# Patient Record
Sex: Female | Born: 1945
Health system: Southern US, Community
[De-identification: ages and names within clinical notes are randomized; demographics above are authoritative.]

## PROBLEM LIST (undated history)

## (undated) DIAGNOSIS — D649 Anemia, unspecified: Secondary | ICD-10-CM

## (undated) DIAGNOSIS — I10 Essential (primary) hypertension: Secondary | ICD-10-CM

## (undated) HISTORY — PX: ABDOMINAL HYSTERECTOMY: SHX81

---

## 1999-02-20 ENCOUNTER — Other Ambulatory Visit: Admission: RE | Admit: 1999-02-20 | Discharge: 1999-02-20 | Payer: Self-pay | Admitting: Family Medicine

## 2000-03-24 ENCOUNTER — Other Ambulatory Visit: Admission: RE | Admit: 2000-03-24 | Discharge: 2000-03-24 | Payer: Self-pay | Admitting: Family Medicine

## 2001-08-11 ENCOUNTER — Other Ambulatory Visit: Admission: RE | Admit: 2001-08-11 | Discharge: 2001-08-11 | Payer: Self-pay | Admitting: Family Medicine

## 2003-04-21 ENCOUNTER — Encounter: Payer: Self-pay | Admitting: Internal Medicine

## 2003-06-12 ENCOUNTER — Emergency Department (HOSPITAL_COMMUNITY): Admission: EM | Admit: 2003-06-12 | Discharge: 2003-06-12 | Payer: Self-pay | Admitting: Emergency Medicine

## 2004-10-15 ENCOUNTER — Ambulatory Visit: Payer: Self-pay | Admitting: Internal Medicine

## 2004-10-23 ENCOUNTER — Ambulatory Visit: Payer: Self-pay | Admitting: Internal Medicine

## 2004-10-29 ENCOUNTER — Ambulatory Visit: Payer: Self-pay | Admitting: Internal Medicine

## 2005-01-28 ENCOUNTER — Ambulatory Visit: Payer: Self-pay | Admitting: Internal Medicine

## 2005-02-04 ENCOUNTER — Ambulatory Visit: Payer: Self-pay | Admitting: Internal Medicine

## 2005-05-28 ENCOUNTER — Ambulatory Visit: Payer: Self-pay | Admitting: Internal Medicine

## 2005-06-09 ENCOUNTER — Ambulatory Visit: Payer: Self-pay | Admitting: Internal Medicine

## 2005-08-12 ENCOUNTER — Ambulatory Visit: Payer: Self-pay | Admitting: Internal Medicine

## 2005-08-18 ENCOUNTER — Ambulatory Visit: Payer: Self-pay

## 2006-02-26 ENCOUNTER — Ambulatory Visit: Payer: Self-pay | Admitting: Internal Medicine

## 2006-04-03 ENCOUNTER — Ambulatory Visit: Payer: Self-pay | Admitting: Internal Medicine

## 2006-04-30 ENCOUNTER — Ambulatory Visit: Payer: Self-pay | Admitting: Internal Medicine

## 2006-05-18 ENCOUNTER — Ambulatory Visit: Payer: Self-pay | Admitting: Internal Medicine

## 2006-06-22 ENCOUNTER — Encounter: Payer: Self-pay | Admitting: Internal Medicine

## 2006-12-03 ENCOUNTER — Ambulatory Visit: Payer: Self-pay | Admitting: Internal Medicine

## 2007-06-25 ENCOUNTER — Encounter: Payer: Self-pay | Admitting: Internal Medicine

## 2007-06-30 DIAGNOSIS — E785 Hyperlipidemia, unspecified: Secondary | ICD-10-CM

## 2007-06-30 DIAGNOSIS — I10 Essential (primary) hypertension: Secondary | ICD-10-CM | POA: Insufficient documentation

## 2007-09-30 ENCOUNTER — Encounter: Payer: Self-pay | Admitting: Internal Medicine

## 2008-02-09 ENCOUNTER — Ambulatory Visit: Payer: Self-pay | Admitting: Internal Medicine

## 2008-02-09 LAB — CONVERTED CEMR LAB
ALT: 17 units/L (ref 0–35)
AST: 21 units/L (ref 0–37)
Albumin: 3.9 g/dL (ref 3.5–5.2)
Alkaline Phosphatase: 76 units/L (ref 39–117)
BUN: 16 mg/dL (ref 6–23)
Basophils Absolute: 0.1 10*3/uL (ref 0.0–0.1)
Basophils Relative: 1 % (ref 0.0–1.0)
Bilirubin Urine: NEGATIVE
Bilirubin, Direct: 0.1 mg/dL (ref 0.0–0.3)
CO2: 32 meq/L (ref 19–32)
Calcium: 9.3 mg/dL (ref 8.4–10.5)
Chloride: 104 meq/L (ref 96–112)
Cholesterol: 244 mg/dL (ref 0–200)
Creatinine, Ser: 0.8 mg/dL (ref 0.4–1.2)
Direct LDL: 184.6 mg/dL
Eosinophils Absolute: 0.3 10*3/uL (ref 0.0–0.7)
Eosinophils Relative: 4.8 % (ref 0.0–5.0)
GFR calc Af Amer: 93 mL/min
GFR calc non Af Amer: 77 mL/min
Glucose, Bld: 109 mg/dL — ABNORMAL HIGH (ref 70–99)
Glucose, Urine, Semiquant: NEGATIVE
HCT: 40.9 % (ref 36.0–46.0)
HDL: 39.5 mg/dL (ref >39.0)
Hemoglobin: 13.3 g/dL (ref 12.0–15.0)
Ketones, urine, test strip: NEGATIVE
Lymphocytes Relative: 30.1 % (ref 12.0–46.0)
MCHC: 32.6 g/dL (ref 30.0–36.0)
MCV: 90.5 fL (ref 78.0–100.0)
Monocytes Absolute: 0.5 10*3/uL (ref 0.1–1.0)
Monocytes Relative: 8.7 % (ref 3.0–12.0)
Neutro Abs: 3 10*3/uL (ref 1.4–7.7)
Neutrophils Relative %: 55.4 % (ref 43.0–77.0)
Nitrite: NEGATIVE
Platelets: 262 10*3/uL (ref 150–400)
Potassium: 4.3 meq/L (ref 3.5–5.1)
Protein, U semiquant: NEGATIVE
RBC: 4.52 M/uL (ref 3.87–5.11)
RDW: 13 % (ref 11.5–14.6)
Sodium: 142 meq/L (ref 135–145)
Specific Gravity, Urine: 1.015
TSH: 1.61 microintl units/mL (ref 0.35–5.50)
Total Bilirubin: 0.4 mg/dL (ref 0.3–1.2)
Total CHOL/HDL Ratio: 6.2
Total Protein: 6.8 g/dL (ref 6.0–8.3)
Triglycerides: 126 mg/dL (ref 0–149)
Urobilinogen, UA: 0.2
VLDL: 25 mg/dL (ref 0–40)
WBC: 5.6 10*3/uL (ref 4.5–10.5)
pH: 7.5

## 2008-02-16 ENCOUNTER — Ambulatory Visit: Payer: Self-pay | Admitting: Internal Medicine

## 2008-09-05 ENCOUNTER — Ambulatory Visit: Payer: Self-pay | Admitting: Family Medicine

## 2008-09-06 ENCOUNTER — Ambulatory Visit: Payer: Self-pay | Admitting: Family Medicine

## 2008-11-22 ENCOUNTER — Encounter: Payer: Self-pay | Admitting: Internal Medicine

## 2008-11-23 ENCOUNTER — Ambulatory Visit: Payer: Self-pay | Admitting: Internal Medicine

## 2008-12-22 ENCOUNTER — Ambulatory Visit: Payer: Self-pay | Admitting: Internal Medicine

## 2008-12-22 DIAGNOSIS — R079 Chest pain, unspecified: Secondary | ICD-10-CM

## 2008-12-25 LAB — CONVERTED CEMR LAB
BUN: 16 mg/dL (ref 6–23)
CO2: 34 meq/L — ABNORMAL HIGH (ref 19–32)
Calcium: 9.7 mg/dL (ref 8.4–10.5)
Chloride: 100 meq/L (ref 96–112)
Creatinine, Ser: 0.7 mg/dL (ref 0.4–1.2)
GFR calc Af Amer: 109 mL/min
GFR calc non Af Amer: 90 mL/min
Glucose, Bld: 79 mg/dL (ref 70–99)
Potassium: 3.9 meq/L (ref 3.5–5.1)
Sodium: 140 meq/L (ref 135–145)

## 2008-12-28 ENCOUNTER — Encounter: Payer: Self-pay | Admitting: Internal Medicine

## 2008-12-28 ENCOUNTER — Ambulatory Visit: Payer: Self-pay

## 2010-11-30 ENCOUNTER — Encounter: Payer: Self-pay | Admitting: Orthopedic Surgery

## 2010-12-10 NOTE — Consult Note (Signed)
Summary: Consultation Report  Consultation Report   Imported By: Kassie Mends 02/21/2008 08:51:18  _____________________________________________________________________  External Attachment:    Type:   Image     Comment:   Dr Yolanda Bonine note

## 2013-02-07 ENCOUNTER — Emergency Department (HOSPITAL_COMMUNITY)
Admission: EM | Admit: 2013-02-07 | Discharge: 2013-02-07 | Disposition: A | Discharge status: Home / Self Care | Payer: No Typology Code available for payment source | Attending: Emergency Medicine | Admitting: Emergency Medicine

## 2013-02-07 ENCOUNTER — Encounter (HOSPITAL_COMMUNITY): Payer: Self-pay | Admitting: Emergency Medicine

## 2013-02-07 ENCOUNTER — Emergency Department (HOSPITAL_COMMUNITY): Payer: No Typology Code available for payment source

## 2013-02-07 DIAGNOSIS — S99919A Unspecified injury of unspecified ankle, initial encounter: Secondary | ICD-10-CM | POA: Insufficient documentation

## 2013-02-07 DIAGNOSIS — Y92410 Unspecified street and highway as the place of occurrence of the external cause: Secondary | ICD-10-CM | POA: Insufficient documentation

## 2013-02-07 DIAGNOSIS — I10 Essential (primary) hypertension: Secondary | ICD-10-CM | POA: Insufficient documentation

## 2013-02-07 DIAGNOSIS — S8990XA Unspecified injury of unspecified lower leg, initial encounter: Secondary | ICD-10-CM | POA: Insufficient documentation

## 2013-02-07 DIAGNOSIS — Y9389 Activity, other specified: Secondary | ICD-10-CM | POA: Insufficient documentation

## 2013-02-07 DIAGNOSIS — S0993XA Unspecified injury of face, initial encounter: Secondary | ICD-10-CM | POA: Insufficient documentation

## 2013-02-07 DIAGNOSIS — IMO0002 Reserved for concepts with insufficient information to code with codable children: Secondary | ICD-10-CM | POA: Insufficient documentation

## 2013-02-07 HISTORY — DX: Essential (primary) hypertension: I10

## 2013-02-07 MED ORDER — METHOCARBAMOL 500 MG PO TABS: 500.0000 mg | ORAL_TABLET | Freq: Two times a day (BID) | ORAL | Status: DC

## 2013-02-07 MED ORDER — IBUPROFEN 600 MG PO TABS: 600.0000 mg | ORAL_TABLET | Freq: Four times a day (QID) | ORAL | Status: DC | PRN

## 2013-02-07 MED ORDER — ONDANSETRON 4 MG PO TBDP
ORAL_TABLET | ORAL | Status: AC
  Filled 2013-02-07: qty 1
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
## 2013-11-21 ENCOUNTER — Ambulatory Visit: Payer: Self-pay | Admitting: Gastroenterology

## 2013-11-23 LAB — PATHOLOGY REPORT

## 2013-12-21 ENCOUNTER — Ambulatory Visit: Payer: Self-pay | Admitting: Gastroenterology

## 2015-03-22 ENCOUNTER — Emergency Department
Admission: EM | Admit: 2015-03-22 | Discharge: 2015-03-22 | Disposition: A | Discharge status: Home / Self Care | Payer: Medicare PPO | Attending: Emergency Medicine | Admitting: Emergency Medicine

## 2015-03-22 ENCOUNTER — Encounter: Payer: Self-pay | Admitting: *Deleted

## 2015-03-22 DIAGNOSIS — Z79899 Other long term (current) drug therapy: Secondary | ICD-10-CM | POA: Insufficient documentation

## 2015-03-22 DIAGNOSIS — W57XXXA Bitten or stung by nonvenomous insect and other nonvenomous arthropods, initial encounter: Secondary | ICD-10-CM | POA: Insufficient documentation

## 2015-03-22 DIAGNOSIS — S30860A Insect bite (nonvenomous) of lower back and pelvis, initial encounter: Secondary | ICD-10-CM | POA: Diagnosis present

## 2015-03-22 DIAGNOSIS — Y998 Other external cause status: Secondary | ICD-10-CM | POA: Insufficient documentation

## 2015-03-22 DIAGNOSIS — Y9389 Activity, other specified: Secondary | ICD-10-CM | POA: Diagnosis not present

## 2015-03-22 DIAGNOSIS — Z880 Allergy status to penicillin: Secondary | ICD-10-CM | POA: Diagnosis not present

## 2015-03-22 DIAGNOSIS — I10 Essential (primary) hypertension: Secondary | ICD-10-CM | POA: Diagnosis not present

## 2015-03-22 DIAGNOSIS — Y9289 Other specified places as the place of occurrence of the external cause: Secondary | ICD-10-CM | POA: Insufficient documentation

## 2015-03-22 NOTE — ED Provider Notes (Signed)
[facility] Emergency Department Provider Note  ____________________________________________  Time seen: 2220  I have reviewed the triage vital signs and the nursing notes.   HISTORY  Chief Complaint Tick Removal    HPI Gabriela Sanchez is a 69 y.o. female noticed a tick on her right lower side this evening and was unable to remove it herself. She denies any symptoms of headache, fever or rash. She is unsure of how long the tick has been there but states that she has been out in the woods the last one to 2 days at most.She denies any pain. She was unable to take it off herself and she could not see in the mirror.   Past Medical History  Diagnosis Date  . Hypertension     Patient Active Problem List   Diagnosis Date Noted  . CHEST PAIN 12/22/2008  . HYPERLIPIDEMIA 06/30/2007  . HYPERTENSION 06/30/2007    History reviewed. No pertinent past surgical history.  Current Outpatient Rx  Name  Route  Sig  Dispense  Refill  . ibuprofen (ADVIL,MOTRIN) 600 MG tablet   Oral   Take 1 tablet (600 mg total) by mouth every 6 (six) hours as needed for pain.   30 tablet   0   . methocarbamol (ROBAXIN) 500 MG tablet   Oral   Take 1 tablet (500 mg total) by mouth 2 (two) times daily.   20 tablet   0   . Multiple Vitamin (MULTIVITAMIN WITH MINERALS) TABS   Oral   Take 1 tablet by mouth daily.         Marland Kitchen. omega-3 acid ethyl esters (LOVAZA) 1 G capsule   Oral   Take 1 g by mouth daily.           Allergies Ampicillin; Erythromycin ethylsuccinate; Simvastatin; and Sulfamethoxazole  No family history on file.  Social History History  Substance Use Topics  . Smoking status: Never Smoker   . Smokeless tobacco: Not on file  . Alcohol Use: No    Review of Systems Constitutional: No fever/chills Eyes: No visual changes. ENT: No sore throat. Cardiovascular: Denies chest pain. Respiratory: Denies shortness of breath. Gastrointestinal: No abdominal  pain.  No nausea, no vomiting.  Genitourinary: Negative for dysuria. Musculoskeletal: Negative for back pain. Skin: Negative for rash. Neurological: Negative for headaches  10-point ROS otherwise negative.  ____________________________________________   PHYSICAL EXAM:  VITAL SIGNS: ED Triage Vitals  Enc Vitals Group     BP 03/22/15 2105 217/66 mmHg     Pulse Rate 03/22/15 2105 69     Resp 03/22/15 2105 16     Temp 03/22/15 2105 98.1 F (36.7 C)     Temp Source 03/22/15 2105 Oral     SpO2 03/22/15 2105 99 %     Weight 03/22/15 2105 200 lb (90.719 kg)     Height 03/22/15 2105 5\' 4"  (1.626 m)     Head Cir --      Peak Flow --      Pain Score --      Pain Loc --      Pain Edu? --      Excl. in GC? --     Constitutional: Alert and oriented. Well appearing and in no acute distress. Eyes: Conjunctivae are normal. PERRL. EOMI. Head: Atraumatic. Nose: No congestion/rhinnorhea. Neck: No stridor.   Cardiovascular: Normal rate, regular rhythm. Grossly normal heart sounds.  Good peripheral circulation. Respiratory: Normal respiratory effort.  Gastrointestinal: Soft and nontender. No  distention. No abdominal bruits. No CVA tenderness. Musculoskeletal: No lower extremity tenderness nor edema.  No joint effusions. Neurologic:  Normal speech and language. No gross focal neurologic deficits are appreciated. Speech is normal. No gait instability. Skin:  Skin is warm, dry and intact. Attached tick was noted right lateral side. There is no rash surrounding the tick area. Psychiatric: Mood and affect are normal. Speech and behavior are normal.  ____________________________________________   LABS (all labs ordered are listed, but only abnormal results are displayed)  Labs Reviewed - No data to  display ____________________________________________  EKG  None ____________________________________________  RADIOLOGY  None ____________________________________________   PROCEDURES  Procedure(s) performed: Tick was removed with Kelly clamp. The area was explored with an 18-gauge needle. The entire tick was removed including head. Without any difficulty  Critical Care performed: No  ____________________________________________   INITIAL IMPRESSION / ASSESSMENT AND PLAN / ED COURSE  Pertinent labs & imaging results that were available during my care of the patient were reviewed by me and considered in my medical decision making (see chart for details).  Discussed symptoms of documented displayed fever and Lyme's disease. Patient prefers not to be on antibiotic and will contact her doctor if any symptoms ____________________________________________   FINAL CLINICAL IMPRESSION(S) / ED DIAGNOSES  Final diagnoses:  Tick bite of back, initial encounter      Tommi RumpsRhonda L Summers, PA-C 03/22/15 2239  I was apparently in the ER and available for consult during the time the patient was here but did not see the patient  Arnaldo NatalPaul F Amire Gossen, MD 03/31/15 [phone]

## 2015-03-22 NOTE — ED Notes (Signed)
Pt noticed a tick on her right lower back today.

## 2015-03-22 NOTE — ED Notes (Signed)
PA removed tick. Pt unsure of how long tick had been in place.

## 2020-03-22 ENCOUNTER — Other Ambulatory Visit: Payer: Self-pay | Admitting: Internal Medicine

## 2020-03-22 DIAGNOSIS — R079 Chest pain, unspecified: Secondary | ICD-10-CM

## 2020-03-30 ENCOUNTER — Other Ambulatory Visit (HOSPITAL_COMMUNITY): Payer: Self-pay | Admitting: Internal Medicine

## 2020-03-30 DIAGNOSIS — R079 Chest pain, unspecified: Secondary | ICD-10-CM

## 2020-04-03 ENCOUNTER — Ambulatory Visit (HOSPITAL_COMMUNITY)
Admission: RE | Admit: 2020-04-03 | Discharge: 2020-04-03 | Disposition: A | Discharge status: Home / Self Care | Payer: Medicare PPO | Source: Ambulatory Visit | Attending: Internal Medicine | Admitting: Internal Medicine

## 2020-04-03 ENCOUNTER — Other Ambulatory Visit: Payer: Self-pay

## 2020-04-03 ENCOUNTER — Encounter (HOSPITAL_COMMUNITY): Payer: Self-pay

## 2020-04-03 DIAGNOSIS — R079 Chest pain, unspecified: Secondary | ICD-10-CM | POA: Insufficient documentation

## 2020-04-03 MED ORDER — REGADENOSON 0.4 MG/5ML IV SOLN: 0.4000 mg | Freq: Once | INTRAVENOUS | Status: DC

## 2020-04-03 MED ORDER — TECHNETIUM TC 99M TETROFOSMIN IV KIT
11.0000 | PACK | Freq: Once | INTRAVENOUS | Status: AC | PRN
  Administered 2020-04-03: 11 via INTRAVENOUS

## 2020-04-03 MED ORDER — REGADENOSON 0.4 MG/5ML IV SOLN
INTRAVENOUS | Status: AC
Start: 2020-04-03 — End: 2020-04-03
  Filled 2020-04-03: qty 5

## 2020-04-03 NOTE — Progress Notes (Signed)
Patient here for nuclear stress test. Due to ordering provider, Dr. Graciela Husbands not having privileges here at [facility], test can not be performed. Dr. Odessa Fleming office aware and will call patient to reschedule at hospital where he does have privileges. Pt aware of plan. D/C home ambulatory with husband. Awake and alert. In no distress.

## 2020-04-13 ENCOUNTER — Telehealth: Payer: Self-pay | Admitting: Internal Medicine

## 2020-04-13 NOTE — Telephone Encounter (Signed)
06/04/021~Per patient, DO NOT SCHD LEXI SCAN/ no longer patient of Dr. Graciela Husbands. MF

## 2020-04-26 ENCOUNTER — Ambulatory Visit: Payer: Medicare PPO | Admitting: Cardiovascular Disease

## 2020-04-26 ENCOUNTER — Encounter: Payer: Self-pay | Admitting: Cardiovascular Disease

## 2020-04-26 ENCOUNTER — Other Ambulatory Visit: Payer: Self-pay

## 2020-04-26 VITALS — BP 136/64 | HR 65 | Ht 64.0 in | Wt 195.6 lb

## 2020-04-26 DIAGNOSIS — I208 Other forms of angina pectoris: Secondary | ICD-10-CM

## 2020-04-26 DIAGNOSIS — Z79899 Other long term (current) drug therapy: Secondary | ICD-10-CM

## 2020-04-26 DIAGNOSIS — R0989 Other specified symptoms and signs involving the circulatory and respiratory systems: Secondary | ICD-10-CM | POA: Insufficient documentation

## 2020-04-26 DIAGNOSIS — I517 Cardiomegaly: Secondary | ICD-10-CM | POA: Diagnosis not present

## 2020-04-26 DIAGNOSIS — R072 Precordial pain: Secondary | ICD-10-CM | POA: Diagnosis not present

## 2020-04-26 DIAGNOSIS — Z01812 Encounter for preprocedural laboratory examination: Secondary | ICD-10-CM

## 2020-04-26 LAB — BASIC METABOLIC PANEL
BUN/Creatinine Ratio: 18 (ref 12–28)
BUN: 18 mg/dL (ref 8–27)
CO2: 25 mmol/L (ref 20–29)
Calcium: 9.5 mg/dL (ref 8.7–10.3)
Chloride: 103 mmol/L (ref 96–106)
Creatinine, Ser: 1 mg/dL (ref 0.57–1.00)
GFR calc Af Amer: 64 mL/min/{1.73_m2} (ref >59)
GFR calc non Af Amer: 56 mL/min/{1.73_m2} — ABNORMAL LOW (ref >59)
Glucose: 82 mg/dL (ref 65–99)
Potassium: 4.8 mmol/L (ref 3.5–5.2)
Sodium: 141 mmol/L (ref 134–144)

## 2020-04-26 MED ORDER — METOPROLOL TARTRATE 50 MG PO TABS: ORAL_TABLET | ORAL | 0 refills | Status: DC

## 2020-04-26 MED ORDER — ATORVASTATIN CALCIUM 10 MG PO TABS: 10.0000 mg | ORAL_TABLET | Freq: Every day | ORAL | 3 refills | Status: DC

## 2020-04-26 NOTE — Progress Notes (Signed)
04/26/2020 Gabriela Sanchez   1946/10/02  [id number]  Primary Physician Lynnea Ferrier, MD Primary Cardiologist: Runell Gess MD FACP, [place], [place], [place]  HPI:  Gabriela Sanchez is a 74 y.o. mild to moderately overweight widowed Caucasian female mother of 2 children, grandmother of 3 grandchildren who is accompanied by C. C. Rierson, her significant other.  She was referred by Dr.Varadarajan, her PCP, for evaluation of chest pain.  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  Her cardiac risk factor profile is notable for treated hypertension and untreated hyperlipidemia.  There is no family history for heart disease.  She is never had a heart attack or stroke.  She was walking on the treadmill 3 days a week and had been noticing chest discomfort while raking the leaves in her yard.  She apparently had a stress test performed at [facility] but only half was performed because no MD was available.   Current Meds  Medication Sig  . amLODipine (NORVASC) 5 MG tablet   . aspirin 81 MG chewable tablet Chew by mouth.  . Cod Liver Oil 5000-500 UNIT/5ML OIL Take by mouth.  . CYANOCOBALAMIN PO Place under the tongue.  . Glucosamine Sulfate 500 MG TABS Take by mouth.  Marland Kitchen ibuprofen (ADVIL,MOTRIN) 600 MG tablet Take 1 tablet (600 mg total) by mouth every 6 (six) hours as needed for pain.  . Multiple Vitamin (MULTIVITAMIN WITH MINERALS) TABS Take 1 tablet by mouth daily.     Allergies  Allergen Reactions  . Ampicillin     REACTION: rash  . Erythromycin Ethylsuccinate     REACTION: rash  . Lisinopril Cough  . Losartan Other (See Comments)    Chest tightness,sob,dizziness  . Other Nausea Only  . Simvastatin     REACTION: ARM PAIN  . Sulfa Antibiotics Other (See Comments)    Gi upset  . Sulfamethoxazole     REACTION: rash    Social History   Socioeconomic History  . Marital status: Widowed    Spouse name: Not on file  .  Number of children: Not on file  . Years of education: Not on file  . Highest education level: Not on file  Occupational History  . Not on file  Tobacco Use  . Smoking status: Never Smoker  . Smokeless tobacco: Never Used  Substance and Sexual Activity  . Alcohol use: No  . Drug use: No  . Sexual activity: Not on file  Other Topics Concern  . Not on file  Social History Narrative  . Not on file   Social Determinants of Health   Financial Resource Strain:   . Difficulty of Paying Living Expenses:   Food Insecurity:   . Worried About Programme researcher, broadcasting/film/video in the Last Year:   . Barista in the Last Year:   Transportation Needs:   . Freight forwarder (Medical):   Marland Kitchen Lack of Transportation (Non-Medical):   Physical Activity:   . Days of Exercise per Week:   . Minutes of Exercise per Session:   Stress:   . Feeling of Stress :   Social Connections:   . Frequency of Communication with Friends and Family:   . Frequency of Social Gatherings with Friends and Family:   . Attends Religious Services:   . Active Member of Clubs or Organizations:   . Attends Banker Meetings:   Marland Kitchen Marital  Status:   Intimate Partner Violence:   . Fear of Current or Ex-Partner:   . Emotionally Abused:   Marland Kitchen Physically Abused:   . Sexually Abused:      Review of Systems: General: negative for chills, fever, night sweats or weight changes.  Cardiovascular: negative for chest pain, dyspnea on exertion, edema, orthopnea, palpitations, paroxysmal nocturnal dyspnea or shortness of breath Dermatological: negative for rash Respiratory: negative for cough or wheezing Urologic: negative for hematuria Abdominal: negative for nausea, vomiting, diarrhea, bright red blood per rectum, melena, or hematemesis Neurologic: negative for visual changes, syncope, or dizziness All other systems reviewed and are otherwise negative except as noted above.    Blood pressure 136/64, pulse 65, height 5'  4" (1.626 m), weight 195 lb 9.6 oz (88.7 kg), SpO2 98 %.  General appearance: alert and no distress Neck: no adenopathy, no JVD, supple, symmetrical, trachea midline, thyroid not enlarged, symmetric, no tenderness/mass/nodules and Bilateral carotid bruits Lungs: clear to auscultation bilaterally Heart: regular rate and rhythm, S1, S2 normal, no murmur, click, rub or gallop Extremities: extremities normal, atraumatic, no cyanosis or edema Pulses: 2+ and symmetric Skin: Skin color, texture, turgor normal. No rashes or lesions Neurologic: Alert and oriented X 3, normal strength and tone. Normal symmetric reflexes. Normal coordination and gait  EKG sinus rhythm at 65 with left ventricular hypertrophy and repolarization changes.  I personally reviewed this EKG.  ASSESSMENT AND PLAN:   HYPERLIPIDEMIA History of hyperlipidemia not on statin therapy with recent lipid profile performed by her PCP on 03/15/2020 revealing total cholesterol 241, LDL 168 and HDL 49.  I am going to begin her on low-dose atorvastatin and we will recheck a lipid liver profile in 2 months.  HYPERTENSION History of essential hypertension a blood pressure measured today 136/64.  She is on amlodipine.  CHEST PAIN History of exertional angina which is somewhat improved after mild weight loss.  She does have positive risk factors.  I am going to get a coronary CTA to further evaluate  Left ventricular hypertrophy Diagnosis left ventricular hypertrophy for several years which is evident on twelve-lead EKG.  We will check a 2D echo to further evaluate  Bilateral carotid bruits Bilateral carotid bruits on exam today.  We will check carotid Doppler studies.      Lorretta Harp MD FACP,FACC,FAHA, [facility] 04/26/2020 10:53 AM

## 2020-04-26 NOTE — Assessment & Plan Note (Signed)
Diagnosis left ventricular hypertrophy for several years which is evident on twelve-lead EKG.  We will check a 2D echo to further evaluate

## 2020-04-26 NOTE — Assessment & Plan Note (Signed)
Bilateral carotid bruits on exam today.  We will check carotid Doppler studies.

## 2020-04-26 NOTE — Patient Instructions (Addendum)
Medication Instructions:  Your Physician recommend you continue on your current medication as directed.      *If you need a refill on your cardiac medications before your next appointment, please call your pharmacy*   Lab Work: Your physician recommends that you return for lab work today ( BMP) and then 2 months to check fasting lipid and liver function.   If you have labs (blood work) drawn today and your tests are completely normal, you will receive your results only by: Marland Kitchen MyChart Message (if you have MyChart) OR . A paper copy in the mail If you have any lab test that is abnormal or we need to change your treatment, we will call you to review the results.   Testing/Procedures: Your physician has requested that you have an echocardiogram. Echocardiography is a painless test that uses sound waves to create images of your heart. It provides your doctor with information about the size and shape of your heart and how well your heart's chambers and valves are working. This procedure takes approximately one hour. There are no restrictions for this procedure. [facility] has requested that you have a carotid duplex. This test is an ultrasound of the carotid arteries in your neck. It looks at blood flow through these arteries that supply the brain with blood. Allow one hour for this exam. There are no restrictions or special instructions. [place]  Cardiac CT Angiography (CTA), is a special type of CT scan that uses a computer to produce multi-dimensional views of major blood vessels throughout the body. In CT angiography, a contrast material is injected through an IV to help visualize the blood vessels [facility]  Follow-Up: At [facility], you and your health needs are our priority.  As part of our continuing mission to provide you with exceptional heart care, we have created designated Provider Care Teams.  These Care Teams  include your primary Cardiologist (physician) and Advanced Practice Providers (APPs -  Physician Assistants and Nurse Practitioners) who all work together to provide you with the care you need, when you need it.  We recommend signing up for the patient portal called "MyChart".  Sign up information is provided on this After Visit Summary.  MyChart is used to connect with patients for Virtual Visits (Telemedicine).  Patients are able to view lab/test results, encounter notes, upcoming appointments, etc.  Non-urgent messages can be sent to your provider as well.   To learn more about what you can do with MyChart, go to [email or web address].    Your next appointment:   1 month(s)  The format for your next appointment:   In Person  Provider:   Quay Burow, MD  Your cardiac CT will be scheduled at one of the below locations:   [facility] [address] [place], [place] [address] [phone]   If scheduled at [facility], please arrive at the [facility] main entrance of [facility] 30 minutes prior to test start time. Proceed to the [facility] Radiology Department (first floor) to check-in and test prep.  If scheduled at [facility], please arrive 15 mins early for check-in and test prep.  Please follow these instructions carefully (unless otherwise directed):   On the Night Before the Test: . Be sure to Drink plenty of water. . Do not consume any caffeinated/decaffeinated beverages or chocolate 12 hours prior to your test. . Do not take  any antihistamines 12 hours prior to your test.   On the Day of the Test: . Drink plenty of water. Do not drink any water within one hour of the test. . Do not eat any food 4 hours prior to the test. . You may take your regular medications prior to the test.  . Take metoprolol 50 mg (Lopressor) two hours prior to test. . FEMALES- please wear underwire-free bra if available         After the Test: . Drink plenty of water. . After receiving IV contrast, you may experience a mild flushed feeling. This is normal. . On occasion, you may experience a mild rash up to 24 hours after the test. This is not dangerous. If this occurs, you can take Benadryl 25 mg and increase your fluid intake. . If you experience trouble breathing, this can be serious. If it is severe call 911 IMMEDIATELY. If it is mild, please call our office. . If you take any of these medications: Glipizide/Metformin, Avandament, Glucavance, please do not take 48 hours after completing test unless otherwise instructed.   Once we have confirmed authorization from your insurance company, we will call you to set up a date and time for your test.   For non-scheduling related questions, please contact the cardiac imaging nurse navigator should you have any questions/concerns: Marchia Bond, Cardiac Imaging Nurse Navigator Burley Saver, Interim Cardiac Imaging Nurse [place] [facility] Direct Office Dial: [phone]   For scheduling needs, including cancellations and rescheduling, please call [phone].

## 2020-04-26 NOTE — Assessment & Plan Note (Signed)
History of hyperlipidemia not on statin therapy with recent lipid profile performed by her PCP on 03/15/2020 revealing total cholesterol 241, LDL 168 and HDL 49.  I am going to begin her on low-dose atorvastatin and we will recheck a lipid liver profile in 2 months.

## 2020-04-26 NOTE — Assessment & Plan Note (Signed)
History of exertional angina which is somewhat improved after mild weight loss.  She does have positive risk factors.  I am going to get a coronary CTA to further evaluate

## 2020-04-26 NOTE — Assessment & Plan Note (Signed)
History of essential hypertension a blood pressure measured today 136/64.  She is on amlodipine.

## 2020-04-26 NOTE — Addendum Note (Signed)
Addended by: Parke Poisson on: 04/26/2020 11:07 AM   Modules accepted: Orders

## 2020-05-01 ENCOUNTER — Ambulatory Visit (HOSPITAL_COMMUNITY): Payer: Medicare PPO | Attending: Cardiology

## 2020-05-01 ENCOUNTER — Other Ambulatory Visit: Payer: Self-pay

## 2020-05-01 DIAGNOSIS — I517 Cardiomegaly: Secondary | ICD-10-CM

## 2020-05-03 ENCOUNTER — Telehealth: Payer: Self-pay | Admitting: Cardiovascular Disease

## 2020-05-03 NOTE — Telephone Encounter (Signed)
New Message   Pt is calling back for results    Please call back   

## 2020-05-03 NOTE — Telephone Encounter (Signed)
Patient called w/results of echo    Gabriela Gess, MD  05/01/2020 6:16 PM EDT     Nl LV systolic FXN with G1DD

## 2020-05-16 ENCOUNTER — Other Ambulatory Visit: Payer: Self-pay

## 2020-05-16 ENCOUNTER — Ambulatory Visit (HOSPITAL_COMMUNITY)
Admission: RE | Admit: 2020-05-16 | Discharge: 2020-05-16 | Disposition: A | Discharge status: Home / Self Care | Payer: Medicare PPO | Source: Ambulatory Visit | Attending: Cardiology | Admitting: Cardiology

## 2020-05-16 DIAGNOSIS — R0989 Other specified symptoms and signs involving the circulatory and respiratory systems: Secondary | ICD-10-CM | POA: Diagnosis not present

## 2020-05-18 ENCOUNTER — Telehealth (HOSPITAL_COMMUNITY): Payer: Self-pay | Admitting: *Deleted

## 2020-05-18 NOTE — Telephone Encounter (Signed)
Attempted to call patient regarding upcoming cardiac CT appointment. °Left message on voicemail with name and callback number ° °Riti Rollyson Tai RN Navigator Cardiac Imaging °[facility] °[phone] Office °[phone] Cell °

## 2020-05-18 NOTE — Telephone Encounter (Signed)
Pt returning call regarding upcoming cardiac imaging study; pt verbalizes understanding of appt date/time, parking situation and where to check in, pre-test NPO status and medications ordered, and verified current allergies; name and call back number provided for further questions should they arise  Gabriela Counts Tai RN Navigator Cardiac Imaging [facility] [phone] office [phone] cell  

## 2020-05-21 ENCOUNTER — Encounter: Payer: Medicare PPO | Admitting: *Deleted

## 2020-05-21 ENCOUNTER — Other Ambulatory Visit: Payer: Self-pay

## 2020-05-21 ENCOUNTER — Encounter (HOSPITAL_COMMUNITY): Payer: Self-pay

## 2020-05-21 ENCOUNTER — Ambulatory Visit (HOSPITAL_COMMUNITY)
Admission: RE | Admit: 2020-05-21 | Discharge: 2020-05-21 | Disposition: A | Discharge status: Home / Self Care | Payer: Medicare PPO | Source: Ambulatory Visit | Attending: Cardiovascular Disease | Admitting: Cardiovascular Disease

## 2020-05-21 DIAGNOSIS — R072 Precordial pain: Secondary | ICD-10-CM | POA: Diagnosis not present

## 2020-05-21 DIAGNOSIS — Z006 Encounter for examination for normal comparison and control in clinical research program: Secondary | ICD-10-CM

## 2020-05-21 MED ORDER — NITROGLYCERIN 0.4 MG SL SUBL
SUBLINGUAL_TABLET | SUBLINGUAL | Status: AC
  Filled 2020-05-21: qty 2

## 2020-05-21 MED ORDER — NITROGLYCERIN 0.4 MG SL SUBL
0.8000 mg | SUBLINGUAL_TABLET | Freq: Once | SUBLINGUAL | Status: AC
  Administered 2020-05-21: 0.8 mg via SUBLINGUAL

## 2020-05-21 MED ORDER — IOHEXOL 350 MG/ML SOLN
80.0000 mL | Freq: Once | INTRAVENOUS | Status: AC | PRN
  Administered 2020-05-21: 80 mL via INTRAVENOUS

## 2020-05-21 NOTE — Research (Signed)
CADFEM Informed Consent                  Subject Name:   Gabriela Sanchez. [place]   Subject met inclusion and exclusion criteria.  The informed consent form, study requirements and expectations were reviewed with the subject and questions and concerns were addressed prior to the signing of the consent form.  The subject verbalized understanding of the trial requirements.  The subject agreed to participate in the CADFEM trial and signed the informed consent.  The informed consent was obtained prior to performance of any protocol-specific procedures for the subject.  A copy of the signed informed consent was given to the subject and a copy was placed in the subject's medical record.   [place] Kanoelani Dobies, Research Assistant  05/21/2020  13:35 p.m.

## 2020-05-21 NOTE — Progress Notes (Signed)
CT scan completed. Tolerated well. D/C home ambulatory with companion. Awake and alert. In no distress 

## 2020-05-29 ENCOUNTER — Encounter: Payer: Self-pay | Admitting: Cardiovascular Disease

## 2020-05-29 ENCOUNTER — Ambulatory Visit: Payer: Medicare PPO | Admitting: Cardiovascular Disease

## 2020-05-29 ENCOUNTER — Other Ambulatory Visit: Payer: Self-pay

## 2020-05-29 VITALS — BP 142/74 | HR 63 | Ht 64.0 in | Wt 192.0 lb

## 2020-05-29 DIAGNOSIS — R0789 Other chest pain: Secondary | ICD-10-CM

## 2020-05-29 DIAGNOSIS — Z79899 Other long term (current) drug therapy: Secondary | ICD-10-CM

## 2020-05-29 DIAGNOSIS — I712 Thoracic aortic aneurysm, without rupture, unspecified: Secondary | ICD-10-CM

## 2020-05-29 NOTE — Patient Instructions (Signed)
Medication Instructions:  NO CHANGE *If you need a refill on your cardiac medications before your next appointment, please call your pharmacy*   Lab Work: Your physician recommends that you return for lab work in: 2 MONTHS=FASTING  If you have labs (blood work) drawn today and your tests are completely normal, you will receive your results only by: Marland Kitchen MyChart Message (if you have MyChart) OR . A paper copy in the mail If you have any lab test that is abnormal or we need to change your treatment, we will call you to review the results.   Testing/Procedures:  CTA OF THE CHEST TO FOLLOW AORTA IN 6 MONTHS   Follow-Up: At [facility], you and your health needs are our priority.  As part of our continuing mission to provide you with exceptional heart care, we have created designated Provider Care Teams.  These Care Teams include your primary Cardiologist (physician) and Advanced Practice Providers (APPs -  Physician Assistants and Nurse Practitioners) who all work together to provide you with the care you need, when you need it.  We recommend signing up for the patient portal called "MyChart".  Sign up information is provided on this After Visit Summary.  MyChart is used to connect with patients for Virtual Visits (Telemedicine).  Patients are able to view lab/test results, encounter notes, upcoming appointments, etc.  Non-urgent messages can be sent to your provider as well.   To learn more about what you can do with MyChart, go to [email or web address].    Your next appointment:   12 month(s)  The format for your next appointment:   In Person  Provider:   You may see Nanetta Batty MD or one of the following Advanced Practice Providers on your designated Care Team:    Corine Shelter, PA-C  [place], [place]  Edd Fabian, [place]

## 2020-05-29 NOTE — Progress Notes (Signed)
Ms. Cleaver returns today for follow-up.  Her carotid Dopplers were essentially normal.  2D echo showed normal LV systolic function, grade 1 diastolic dysfunction, mild AI and MR.  She did have a coronary CTA performed as well on 05/21/2020 that showed a coronary calcium score of 0 with no evidence of CAD.  She said no recurrent chest pain.  There was an incidental finding of a small penetrating ulcer in her descending thoracic aorta.  We will recheck a chest CTA in 6 months.  I also began her on atorvastatin at her last office visit and will we will recheck a lipid liver profile in 2 months.  Runell Gess, M.D., FACP, [facility], [facility] [address] [place], [place]  [address]  [phone] 05/29/2020 12:20 PM

## 2020-06-28 ENCOUNTER — Ambulatory Visit: Payer: Medicare PPO | Admitting: Interventional Cardiology

## 2020-08-08 DIAGNOSIS — H25813 Combined forms of age-related cataract, bilateral: Secondary | ICD-10-CM | POA: Diagnosis not present

## 2020-08-08 DIAGNOSIS — H524 Presbyopia: Secondary | ICD-10-CM | POA: Diagnosis not present

## 2020-08-08 DIAGNOSIS — H52223 Regular astigmatism, bilateral: Secondary | ICD-10-CM | POA: Diagnosis not present

## 2020-08-08 DIAGNOSIS — H5203 Hypermetropia, bilateral: Secondary | ICD-10-CM | POA: Diagnosis not present

## 2021-04-10 ENCOUNTER — Encounter: Payer: Self-pay | Admitting: Cardiovascular Disease

## 2021-04-10 ENCOUNTER — Other Ambulatory Visit: Payer: Self-pay

## 2021-04-10 ENCOUNTER — Ambulatory Visit (INDEPENDENT_AMBULATORY_CARE_PROVIDER_SITE_OTHER): Payer: Medicare PPO | Admitting: Cardiovascular Disease

## 2021-04-10 DIAGNOSIS — R011 Cardiac murmur, unspecified: Secondary | ICD-10-CM | POA: Insufficient documentation

## 2021-04-10 DIAGNOSIS — R0989 Other specified symptoms and signs involving the circulatory and respiratory systems: Secondary | ICD-10-CM

## 2021-04-10 DIAGNOSIS — I712 Thoracic aortic aneurysm, without rupture, unspecified: Secondary | ICD-10-CM

## 2021-04-10 DIAGNOSIS — I517 Cardiomegaly: Secondary | ICD-10-CM

## 2021-04-10 DIAGNOSIS — I259 Chronic ischemic heart disease, unspecified: Secondary | ICD-10-CM

## 2021-04-10 LAB — BASIC METABOLIC PANEL
BUN/Creatinine Ratio: 13 (ref 12–28)
BUN: 16 mg/dL (ref 8–27)
CO2: 23 mmol/L (ref 20–29)
Calcium: 9.6 mg/dL (ref 8.7–10.3)
Chloride: 103 mmol/L (ref 96–106)
Creatinine, Ser: 1.22 mg/dL — ABNORMAL HIGH (ref 0.57–1.00)
Glucose: 110 mg/dL — ABNORMAL HIGH (ref 65–99)
Potassium: 4.5 mmol/L (ref 3.5–5.2)
Sodium: 141 mmol/L (ref 134–144)
eGFR: 46 mL/min/{1.73_m2} — ABNORMAL LOW (ref >59)

## 2021-04-10 NOTE — Assessment & Plan Note (Signed)
Carotid Doppler showed no evidence of ICA stenosis. 

## 2021-04-10 NOTE — Assessment & Plan Note (Signed)
History of hyperlipidemia not on statin therapy with LDL in the 170 range..  I did begin her on atorvastatin which she could not tolerate.

## 2021-04-10 NOTE — Assessment & Plan Note (Signed)
History of atypical chest pain with a coronary CTA performed 05/21/2020 which was entirely normal.  She did have an incidental finding of a small penetrating ulcer in her descending thoracic aorta.  We will recheck a CTA.

## 2021-04-10 NOTE — Assessment & Plan Note (Signed)
History of essential hypertension a blood pressure measured today at 128/54.  She is on amlodipine.

## 2021-04-10 NOTE — Patient Instructions (Signed)
Medication Instructions:  Your physician recommends that you continue on your current medications as directed. Please refer to the Current Medication list given to you today.  *If you need a refill on your cardiac medications before your next appointment, please call your pharmacy*   Lab Work: Your physician recommends that you have labs drawn today: BMET  If you have labs (blood work) drawn today and your tests are completely normal, you will receive your results only by: Marland Kitchen MyChart Message (if you have MyChart) OR . A paper copy in the mail If you have any lab test that is abnormal or we need to change your treatment, we will call you to review the results.   Testing/Procedures: Your physician has requested that you have an echocardiogram. Echocardiography is a painless test that uses sound waves to create images of your heart. It provides your doctor with information about the size and shape of your heart and how well your heart's chambers and valves are working. This procedure takes approximately one hour. There are no restrictions for this procedure. This procedure is done at [address]  Non-Cardiac CT Angiography (CTA), is a special type of CT scan that uses a computer to produce multi-dimensional views of major blood vessels throughout the body. In CT angiography, a contrast material is injected through an IV to help visualize the blood vessels.    Follow-Up: At [facility], you and your health needs are our priority.  As part of our continuing mission to provide you with exceptional heart care, we have created designated Provider Care Teams.  These Care Teams include your primary Cardiologist (physician) and Advanced Practice Providers (APPs -  Physician Assistants and Nurse Practitioners) who all work together to provide you with the care you need, when you need it.  We recommend signing up for the patient portal called "MyChart".  Sign up information is provided  on this After Visit Summary.  MyChart is used to connect with patients for Virtual Visits (Telemedicine).  Patients are able to view lab/test results, encounter notes, upcoming appointments, etc.  Non-urgent messages can be sent to your provider as well.   To learn more about what you can do with MyChart, go to [email or web address].    Your next appointment:   12 month(s)  The format for your next appointment:   In Person  Provider:   Nanetta Batty, MD

## 2021-04-10 NOTE — Progress Notes (Signed)
04/10/2021 Gabriela Sanchez   [birth year]  [id number]  Primary Physician Lorenda Ishihara, MD Primary Cardiologist: Runell Gess MD FACP, [place], [place], [place]  HPI:  Gabriela Sanchez is a 75 y.o.  mild to moderately overweight widowed Caucasian female mother of 2 children, grandmother of 3 grandchildren who I last saw in the office 05/29/2020. She was referred by Dr.Varadarajan, her PCP, for evaluation of chest pain.  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  Her cardiac risk factor profile is notable for treated hypertension and untreated hyperlipidemia.  There is no family history for heart disease.  She is never had a heart attack or stroke.  She was walking on the treadmill 3 days a week and had been noticing chest discomfort while raking the leaves in her yard.  She apparently had a stress test performed at [facility] but only half was performed because no MD was available.  I performed 2D echocardiography on her 05/01/2020 that showed normal LV systolic function, grade 1 diastolic dysfunction with mild LVH.  She also had a 2 m/s outflow tract gradient which may be related to her hyperdynamic LV function.  There is mild AI and MR noted as well.  Carotid Dopplers were normal.  Coronary CTA showed a coronary calcium score of 0 no evidence of CAD.  Her major complaints are of dyspnea on exertion.  She did have an incidentally found small penetrating thoracic aortic ulcer on CTA.   Current Meds  Medication Sig  . amLODipine (NORVASC) 5 MG tablet   . aspirin 81 MG chewable tablet Chew by mouth.  . CYANOCOBALAMIN PO Place under the tongue.  . Glucosamine Sulfate 500 MG TABS Take by mouth.  Marland Kitchen ibuprofen (ADVIL,MOTRIN) 600 MG tablet Take 1 tablet (600 mg total) by mouth every 6 (six) hours as needed for pain.  . Multiple Vitamin (MULTIVITAMIN WITH MINERALS) TABS Take 1 tablet by mouth daily.  Marland Kitchen omega-3 acid ethyl esters (LOVAZA)  1 G capsule Take 1 g by mouth daily.  . [DISCONTINUED] Cod Liver Oil 5000-500 UNIT/5ML OIL Take by mouth.  . [DISCONTINUED] metoprolol tartrate (LOPRESSOR) 50 MG tablet TAKE 1 TABLET 2 HR PRIOR TO CARDIAC PROCEDURE  . [DISCONTINUED] rosuvastatin (CRESTOR) 10 MG tablet Take 10 mg by mouth daily.     Allergies  Allergen Reactions  . Ampicillin     REACTION: rash  . Erythromycin Ethylsuccinate     REACTION: rash  . Lisinopril Cough  . Losartan Other (See Comments)    Chest tightness,sob,dizziness  . Other Nausea Only  . Simvastatin     REACTION: ARM PAIN  . Sulfa Antibiotics Other (See Comments)    Gi upset  . Sulfamethoxazole     REACTION: rash    Social History   Socioeconomic History  . Marital status: Widowed    Spouse name: Not on file  . Number of children: Not on file  . Years of education: Not on file  . Highest education level: Not on file  Occupational History  . Not on file  Tobacco Use  . Smoking status: Never Smoker  . Smokeless tobacco: Never Used  Substance and Sexual Activity  . Alcohol use: No  . Drug use: No  . Sexual activity: Not on file  Other Topics Concern  . Not on file  Social History Narrative  . Not on file   Social Determinants of Health   Financial Resource  Strain: Not on file  Food Insecurity: Not on file  Transportation Needs: Not on file  Physical Activity: Not on file  Stress: Not on file  Social Connections: Not on file  Intimate Partner Violence: Not on file     Review of Systems: General: negative for chills, fever, night sweats or weight changes.  Cardiovascular: negative for chest pain, dyspnea on exertion, edema, orthopnea, palpitations, paroxysmal nocturnal dyspnea or shortness of breath Dermatological: negative for rash Respiratory: negative for cough or wheezing Urologic: negative for hematuria Abdominal: negative for nausea, vomiting, diarrhea, bright red blood per rectum, melena, or hematemesis Neurologic:  negative for visual changes, syncope, or dizziness All other systems reviewed and are otherwise negative except as noted above.    Blood pressure (!) 128/54, pulse 67, height 5\' 4"  (1.626 m), weight 188 lb (85.3 kg).  General appearance: alert and no distress Neck: no adenopathy, no JVD, supple, symmetrical, trachea midline, thyroid not enlarged, symmetric, no tenderness/mass/nodules and Bilateral carotid bruits versus transmitted murmur. Lungs: clear to auscultation bilaterally Heart: 2/6 high-pitched outflow tract murmur heard best at the base. Extremities: extremities normal, atraumatic, no cyanosis or edema Pulses: 2+ and symmetric Skin: Skin color, texture, turgor normal. No rashes or lesions Neurologic: Alert and oriented X 3, normal strength and tone. Normal symmetric reflexes. Normal coordination and gait  EKG sinus rhythm at 67 with evidence of LVH with repolarization changes and septal Q waves.  I personally reviewed this EKG.  ASSESSMENT AND PLAN:   HYPERLIPIDEMIA History of hyperlipidemia not on statin therapy with LDL in the 170 range..  I did begin her on atorvastatin which she could not tolerate.  HYPERTENSION History of essential hypertension a blood pressure measured today at 128/54.  She is on amlodipine.  CHEST PAIN History of atypical chest pain with a coronary CTA performed 05/21/2020 which was entirely normal.  She did have an incidental finding of a small penetrating ulcer in her descending thoracic aorta.  We will recheck a CTA.  Left ventricular hypertrophy History of LVH on twelve-lead EKG with 2D echo that did show mild LVH 05/01/2020.  Bilateral carotid bruits Carotid Doppler showed no evidence of ICA stenosis.  Cardiac murmur Outflow tract murmur on exam today with echo that suggested a 2 m/s outflow track gradient but no mention of basal septal hypertrophy.  She does complain of increasing dyspnea on exertion.  I am going to repeat a 2D  echocardiogram.      05/03/2020 MD [facility], [facility] 04/10/2021 11:09 AM

## 2021-04-10 NOTE — Assessment & Plan Note (Signed)
History of LVH on twelve-lead EKG with 2D echo that did show mild LVH 05/01/2020.

## 2021-04-10 NOTE — Assessment & Plan Note (Signed)
Outflow tract murmur on exam today with echo that suggested a 2 m/s outflow track gradient but no mention of basal septal hypertrophy.  She does complain of increasing dyspnea on exertion.  I am going to repeat a 2D echocardiogram.

## 2021-05-14 ENCOUNTER — Ambulatory Visit (HOSPITAL_COMMUNITY): Payer: Medicare PPO | Attending: Cardiology

## 2021-05-14 ENCOUNTER — Other Ambulatory Visit: Payer: Self-pay

## 2021-05-14 DIAGNOSIS — R011 Cardiac murmur, unspecified: Secondary | ICD-10-CM | POA: Insufficient documentation

## 2021-05-14 DIAGNOSIS — I712 Thoracic aortic aneurysm, without rupture, unspecified: Secondary | ICD-10-CM

## 2021-05-14 DIAGNOSIS — I517 Cardiomegaly: Secondary | ICD-10-CM | POA: Diagnosis not present

## 2021-05-14 LAB — ECHOCARDIOGRAM COMPLETE
Area-P 1/2: 2.34 cm2
S' Lateral: 1.8 cm

## 2021-05-15 ENCOUNTER — Encounter: Payer: Self-pay | Admitting: *Deleted

## 2021-05-15 ENCOUNTER — Telehealth: Payer: Self-pay | Admitting: *Deleted

## 2021-05-15 DIAGNOSIS — Z01812 Encounter for preprocedural laboratory examination: Secondary | ICD-10-CM

## 2021-05-15 DIAGNOSIS — I712 Thoracic aortic aneurysm, without rupture, unspecified: Secondary | ICD-10-CM

## 2021-05-15 NOTE — Telephone Encounter (Signed)
Received call from [place] [facility]. Patient CTA scheduled 7/28. Last BMET was 6/1.  Needs repeat BMET prior to CTA.  Order placed, will make patient aware.

## 2021-05-23 DIAGNOSIS — I712 Thoracic aortic aneurysm, without rupture: Secondary | ICD-10-CM | POA: Diagnosis not present

## 2021-05-23 DIAGNOSIS — Z01812 Encounter for preprocedural laboratory examination: Secondary | ICD-10-CM | POA: Diagnosis not present

## 2021-05-23 LAB — BASIC METABOLIC PANEL
BUN/Creatinine Ratio: 13 (ref 12–28)
BUN: 15 mg/dL (ref 8–27)
CO2: 24 mmol/L (ref 20–29)
Calcium: 9.4 mg/dL (ref 8.7–10.3)
Chloride: 102 mmol/L (ref 96–106)
Creatinine, Ser: 1.12 mg/dL — ABNORMAL HIGH (ref 0.57–1.00)
Glucose: 120 mg/dL — ABNORMAL HIGH (ref 65–99)
Potassium: 4.8 mmol/L (ref 3.5–5.2)
Sodium: 140 mmol/L (ref 134–144)
eGFR: 51 mL/min/{1.73_m2} — ABNORMAL LOW (ref >59)

## 2021-06-06 ENCOUNTER — Other Ambulatory Visit: Payer: Self-pay

## 2021-06-06 ENCOUNTER — Ambulatory Visit (INDEPENDENT_AMBULATORY_CARE_PROVIDER_SITE_OTHER)
Admission: RE | Admit: 2021-06-06 | Discharge: 2021-06-06 | Disposition: A | Discharge status: Home / Self Care | Payer: Medicare PPO | Source: Ambulatory Visit | Attending: Cardiovascular Disease | Admitting: Cardiovascular Disease

## 2021-06-06 DIAGNOSIS — I712 Thoracic aortic aneurysm, without rupture, unspecified: Secondary | ICD-10-CM

## 2021-06-06 DIAGNOSIS — R911 Solitary pulmonary nodule: Secondary | ICD-10-CM | POA: Diagnosis not present

## 2021-06-06 MED ORDER — IOHEXOL 350 MG/ML SOLN
100.0000 mL | Freq: Once | INTRAVENOUS | Status: AC | PRN
  Administered 2021-06-06: 100 mL via INTRAVENOUS

## 2021-06-11 DIAGNOSIS — I1310 Hypertensive heart and chronic kidney disease without heart failure, with stage 1 through stage 4 chronic kidney disease, or unspecified chronic kidney disease: Secondary | ICD-10-CM | POA: Diagnosis not present

## 2021-06-11 DIAGNOSIS — E785 Hyperlipidemia, unspecified: Secondary | ICD-10-CM | POA: Diagnosis not present

## 2021-06-11 DIAGNOSIS — N1831 Chronic kidney disease, stage 3a: Secondary | ICD-10-CM | POA: Diagnosis not present

## 2021-06-11 DIAGNOSIS — R7303 Prediabetes: Secondary | ICD-10-CM | POA: Diagnosis not present

## 2021-06-11 DIAGNOSIS — Z13820 Encounter for screening for osteoporosis: Secondary | ICD-10-CM | POA: Diagnosis not present

## 2021-06-14 ENCOUNTER — Other Ambulatory Visit: Payer: Self-pay | Admitting: Internal Medicine

## 2021-06-14 DIAGNOSIS — Z13820 Encounter for screening for osteoporosis: Secondary | ICD-10-CM

## 2021-07-11 DIAGNOSIS — Z1231 Encounter for screening mammogram for malignant neoplasm of breast: Secondary | ICD-10-CM | POA: Diagnosis not present

## 2021-08-08 DIAGNOSIS — I10 Essential (primary) hypertension: Secondary | ICD-10-CM | POA: Diagnosis not present

## 2021-08-08 DIAGNOSIS — T7840XS Allergy, unspecified, sequela: Secondary | ICD-10-CM | POA: Diagnosis not present

## 2021-08-08 DIAGNOSIS — H524 Presbyopia: Secondary | ICD-10-CM | POA: Diagnosis not present

## 2021-08-08 DIAGNOSIS — H5203 Hypermetropia, bilateral: Secondary | ICD-10-CM | POA: Diagnosis not present

## 2021-08-08 DIAGNOSIS — H52223 Regular astigmatism, bilateral: Secondary | ICD-10-CM | POA: Diagnosis not present

## 2021-08-08 DIAGNOSIS — H2513 Age-related nuclear cataract, bilateral: Secondary | ICD-10-CM | POA: Diagnosis not present

## 2021-10-15 DIAGNOSIS — D649 Anemia, unspecified: Secondary | ICD-10-CM | POA: Diagnosis not present

## 2021-10-15 DIAGNOSIS — I700 Atherosclerosis of aorta: Secondary | ICD-10-CM | POA: Diagnosis not present

## 2021-10-15 DIAGNOSIS — E785 Hyperlipidemia, unspecified: Secondary | ICD-10-CM | POA: Diagnosis not present

## 2021-10-15 DIAGNOSIS — R7303 Prediabetes: Secondary | ICD-10-CM | POA: Diagnosis not present

## 2021-10-22 DIAGNOSIS — Z1339 Encounter for screening examination for other mental health and behavioral disorders: Secondary | ICD-10-CM | POA: Diagnosis not present

## 2021-10-22 DIAGNOSIS — R7303 Prediabetes: Secondary | ICD-10-CM | POA: Diagnosis not present

## 2021-10-22 DIAGNOSIS — I1310 Hypertensive heart and chronic kidney disease without heart failure, with stage 1 through stage 4 chronic kidney disease, or unspecified chronic kidney disease: Secondary | ICD-10-CM | POA: Diagnosis not present

## 2021-10-22 DIAGNOSIS — I7789 Other specified disorders of arteries and arterioles: Secondary | ICD-10-CM | POA: Diagnosis not present

## 2021-10-22 DIAGNOSIS — N1831 Chronic kidney disease, stage 3a: Secondary | ICD-10-CM | POA: Diagnosis not present

## 2021-10-22 DIAGNOSIS — Z0000 Encounter for general adult medical examination without abnormal findings: Secondary | ICD-10-CM | POA: Diagnosis not present

## 2021-10-22 DIAGNOSIS — Z1331 Encounter for screening for depression: Secondary | ICD-10-CM | POA: Diagnosis not present

## 2021-10-22 DIAGNOSIS — D509 Iron deficiency anemia, unspecified: Secondary | ICD-10-CM | POA: Diagnosis not present

## 2021-10-22 DIAGNOSIS — I700 Atherosclerosis of aorta: Secondary | ICD-10-CM | POA: Diagnosis not present

## 2021-10-22 DIAGNOSIS — E785 Hyperlipidemia, unspecified: Secondary | ICD-10-CM | POA: Diagnosis not present

## 2021-10-25 ENCOUNTER — Telehealth: Payer: Self-pay | Admitting: Oncology

## 2021-10-25 ENCOUNTER — Telehealth: Payer: Self-pay | Admitting: Cardiovascular Disease

## 2021-10-25 NOTE — Telephone Encounter (Signed)
Called patient, advised letter with labs were placed up front for pick up. Patient verbalized understanding.

## 2021-10-25 NOTE — Telephone Encounter (Signed)
° °  Pt is requesting a paper copy of her labs done on 04/10/21. She said she doesn't like using mychart and couldn't fine her result there.  she asked, if she can just pick it up at the office on monday

## 2021-10-25 NOTE — Telephone Encounter (Signed)
Scheduled appt per 12/16 referral. Pt is aware of appt date and time.  °

## 2021-10-30 DIAGNOSIS — D509 Iron deficiency anemia, unspecified: Secondary | ICD-10-CM | POA: Diagnosis not present

## 2021-11-07 ENCOUNTER — Inpatient Hospital Stay: Payer: Medicare PPO

## 2021-11-07 ENCOUNTER — Inpatient Hospital Stay: Payer: Medicare PPO | Attending: Oncology | Admitting: Oncology

## 2021-11-07 ENCOUNTER — Other Ambulatory Visit: Payer: Self-pay

## 2021-11-07 VITALS — BP 170/50 | HR 78 | Temp 97.5°F | Resp 17 | Wt 176.9 lb

## 2021-11-07 DIAGNOSIS — R634 Abnormal weight loss: Secondary | ICD-10-CM | POA: Diagnosis not present

## 2021-11-07 DIAGNOSIS — D509 Iron deficiency anemia, unspecified: Secondary | ICD-10-CM | POA: Diagnosis not present

## 2021-11-07 DIAGNOSIS — D508 Other iron deficiency anemias: Secondary | ICD-10-CM

## 2021-11-07 DIAGNOSIS — R109 Unspecified abdominal pain: Secondary | ICD-10-CM | POA: Diagnosis not present

## 2021-11-07 LAB — CBC WITH DIFFERENTIAL (CANCER CENTER ONLY)
Abs Immature Granulocytes: 0.02 10*3/uL (ref 0.00–0.07)
Basophils Absolute: 0.1 10*3/uL (ref 0.0–0.1)
Basophils Relative: 1 %
Eosinophils Absolute: 0.3 10*3/uL (ref 0.0–0.5)
Eosinophils Relative: 4 %
HCT: 20 % — ABNORMAL LOW (ref 36.0–46.0)
Hemoglobin: 5.2 g/dL — CL (ref 12.0–15.0)
Immature Granulocytes: 0 %
Lymphocytes Relative: 22 %
Lymphs Abs: 1.7 10*3/uL (ref 0.7–4.0)
MCH: 15.5 pg — ABNORMAL LOW (ref 26.0–34.0)
MCHC: 26 g/dL — ABNORMAL LOW (ref 30.0–36.0)
MCV: 59.5 fL — ABNORMAL LOW (ref 80.0–100.0)
Monocytes Absolute: 0.9 10*3/uL (ref 0.1–1.0)
Monocytes Relative: 12 %
Neutro Abs: 4.8 10*3/uL (ref 1.7–7.7)
Neutrophils Relative %: 61 %
Platelet Count: 425 10*3/uL — ABNORMAL HIGH (ref 150–400)
RBC: 3.36 MIL/uL — ABNORMAL LOW (ref 3.87–5.11)
RDW: 21.3 % — ABNORMAL HIGH (ref 11.5–15.5)
WBC Count: 7.7 10*3/uL (ref 4.0–10.5)
nRBC: 0 % (ref 0.0–0.2)

## 2021-11-07 LAB — IRON AND IRON BINDING CAPACITY (CC-WL,HP ONLY)
Iron: 9 ug/dL — ABNORMAL LOW (ref 28–170)
Saturation Ratios: 2 % — ABNORMAL LOW (ref 10.4–31.8)
TIBC: 528 ug/dL — ABNORMAL HIGH (ref 250–450)
UIBC: 519 ug/dL — ABNORMAL HIGH (ref 148–442)

## 2021-11-07 NOTE — Progress Notes (Signed)
Reason for the request:    Anemia  HPI: I was asked by Dr. Gerhard Munch to evaluate Gabriela Sanchez for the evaluation of iron deficiency anemia.  She is a 75 year old woman with history of hypertension, hyperlipidemia was found to have severe anemia based on CBC obtained on October 15, 2021.  At that time her hemoglobin was 5.4 with a hematocrit of 19.9.  Her MCV was 57 with RDW of 14.9.  Iron studies at that time showed a ferritin of 7 with iron level of 12.  Binding capacity at 397.  She was started on iron oral replacement at that time.  Previous CBC in 2009 was normal.  Clinically, she reports excessive fatigue, tiredness and dyspnea on exertion that dates back for the last 6 months.  She has also reported some flank pain and abdominal discomfort.  She has reported 20 pound weight loss some of it is intentional.  She started oral iron replacement in the last 3 weeks which she has tolerated reasonably well although has not made any difference in her symptoms.  She is still able to drive and attends activities of daily living.  She denies hematochezia, melena or hemoptysis.  She denies any hematemesis.   She does not report any headaches, blurry vision, syncope or seizures. Does not report any fevers, chills or sweats.  Does not report any cough, wheezing or hemoptysis.  Does not report any chest pain, palpitation, orthopnea or leg edema.  Does not report any nausea, vomiting or abdominal pain.  Does not report any constipation or diarrhea.  Does not report any skeletal complaints.    Does not report frequency, urgency or hematuria.  Does not report any skin rashes or lesions. Does not report any heat or cold intolerance.  Does not report any lymphadenopathy or petechiae.  Does not report any anxiety or depression.  Remaining review of systems is negative.     Past Medical History:  Diagnosis Date   Hypertension   :  No past surgical history on file.:   Current Outpatient Medications:    amLODipine  (NORVASC) 5 MG tablet, , Disp: , Rfl:    aspirin 81 MG chewable tablet, Chew by mouth., Disp: , Rfl:    CYANOCOBALAMIN PO, Place under the tongue., Disp: , Rfl:    Glucosamine Sulfate 500 MG TABS, Take by mouth., Disp: , Rfl:    ibuprofen (ADVIL,MOTRIN) 600 MG tablet, Take 1 tablet (600 mg total) by mouth every 6 (six) hours as needed for pain., Disp: 30 tablet, Rfl: 0   Multiple Vitamin (MULTIVITAMIN WITH MINERALS) TABS, Take 1 tablet by mouth daily., Disp: , Rfl:    omega-3 acid ethyl esters (LOVAZA) 1 G capsule, Take 1 g by mouth daily., Disp: , Rfl: :   Allergies  Allergen Reactions   Ampicillin     REACTION: rash   Erythromycin Ethylsuccinate     REACTION: rash   Lisinopril Cough   Losartan Other (See Comments)    Chest tightness,sob,dizziness   Other Nausea Only   Simvastatin     REACTION: ARM PAIN   Sulfa Antibiotics Other (See Comments)    Gi upset   Sulfamethoxazole     REACTION: rash  :  No family history on file.:   Social History   Socioeconomic History   Marital status: Widowed    Spouse name: Not on file   Number of children: Not on file   Years of education: Not on file   Highest education level: Not on  file  Occupational History   Not on file  Tobacco Use   Smoking status: Never   Smokeless tobacco: Never  Substance and Sexual Activity   Alcohol use: No   Drug use: No   Sexual activity: Not on file  Other Topics Concern   Not on file  Social History Narrative   Not on file   Social Determinants of Health   Financial Resource Strain: Not on file  Food Insecurity: Not on file  Transportation Needs: Not on file  Physical Activity: Not on file  Stress: Not on file  Social Connections: Not on file  Intimate Partner Violence: Not on file  :  Pertinent items are noted in HPI.  Exam: Blood pressure (!) 170/50, pulse 78, temperature (!) 97.5 F (36.4 C), temperature source Tympanic, resp. rate 17, weight 176 lb 14.4 oz (80.2 kg), SpO2 100  %.  General appearance: alert and cooperative appeared without distress. Head: atraumatic without any abnormalities. Eyes: conjunctivae/corneas clear. PERRL.  Sclera anicteric. Throat: lips, mucosa, and tongue normal; without oral thrush or ulcers. Resp: clear to auscultation bilaterally without rhonchi, wheezes or dullness to percussion. Cardio: regular rate and rhythm, S1, S2 normal, no murmur, click, rub or gallop GI: soft, non-tender; bowel sounds normal; no masses,  no organomegaly Skin: Skin color, texture, turgor normal. No rashes or lesions Lymph nodes: Cervical, supraclavicular, and axillary nodes normal. Neurologic: Grossly normal without any motor, sensory or deep tendon reflexes. Musculoskeletal: No joint deformity or effusion.    Assessment and Plan:   75 year old with:  1.  Iron deficiency anemia documented in December 2022 after presenting with hemoglobin of 5.4, ferritin of 7 and iron level of 12.  The differential diagnosis and management choices were reviewed at this time.  The etiology of her iron deficiency is unclear.  Poor iron absorption versus chronic GI blood losses were reiterated.  Evaluation for GI source including malignancy is highly recommended in this particular setting.  Management options at this time were discussed.  Continuing oral iron therapy versus switching to intravenous iron infusion were reiterated.  Complication associated with intravenous iron infusion include arthralgias, myalgias and rarely anaphylaxis.  After discussion today, I recommended proceeding with iron sucrose for a total of 1000 mg in the near future.  She could have issues with oral iron absorption which has not been effective for the last 3 weeks.  2.  Colon cancer and GI malignancy screening: Recommend GI evaluation given her iron deficiency.  She has also concerning symptoms of malignancy including weight loss and abdominal pain and flank pain.  I will obtain CT scan to fully  evaluate that and we will rule out any malignancy.  3.  Follow-up: We will be in the next few weeks to follow his progress.  45  minutes were dedicated to this visit. The time was spent on reviewing laboratory data, discussing treatment options, discussing differential diagnosis and answering questions regarding future plan.     A copy of this consult has been forwarded to the requesting physician.

## 2021-11-08 ENCOUNTER — Other Ambulatory Visit: Payer: Self-pay | Admitting: *Deleted

## 2021-11-08 DIAGNOSIS — D508 Other iron deficiency anemias: Secondary | ICD-10-CM

## 2021-11-08 LAB — FERRITIN: Ferritin: 5 ng/mL — ABNORMAL LOW (ref 11–307)

## 2021-11-15 ENCOUNTER — Telehealth: Payer: Self-pay | Admitting: *Deleted

## 2021-11-15 MED FILL — Iron Sucrose Inj 20 MG/ML (Fe Equiv): INTRAVENOUS | Qty: 10 | Status: AC

## 2021-11-15 NOTE — Telephone Encounter (Signed)
-----   Message from Benjiman Core, MD sent at 11/15/2021  1:58 PM EST ----- Yes she needs all infusions.  She is very deficient and without these infusions she will start feeling bad again. ----- Message ----- From: Arville Care, RN Sent: 11/15/2021   1:54 PM EST To: Benjiman Core, MD  Ms Muma called, she is scheduled for her first iron infusion tomorrow.  She reports that she is feeling better & has had more energy for the past two days so she is asking if the infusion is necessary.  She is also asking if she would need labwork before starting the infusion since she has had this change.  Please advise.  Thanks, Darel Hong

## 2021-11-15 NOTE — Telephone Encounter (Signed)
PC to patient, informed her of Dr. Alver Fisher advice below - she verbalizes understanding & will be here for iron infusion tomorrow.

## 2021-11-16 ENCOUNTER — Inpatient Hospital Stay: Payer: Medicare PPO | Attending: Oncology

## 2021-11-16 ENCOUNTER — Other Ambulatory Visit: Payer: Self-pay

## 2021-11-16 VITALS — BP 148/56 | HR 77 | Temp 97.3°F | Resp 16

## 2021-11-16 DIAGNOSIS — D509 Iron deficiency anemia, unspecified: Secondary | ICD-10-CM | POA: Diagnosis not present

## 2021-11-16 DIAGNOSIS — D508 Other iron deficiency anemias: Secondary | ICD-10-CM

## 2021-11-16 MED ORDER — SODIUM CHLORIDE 0.9 % IV SOLN: Freq: Once | INTRAVENOUS | Status: AC

## 2021-11-16 MED ORDER — SODIUM CHLORIDE 0.9 % IV SOLN
200.0000 mg | Freq: Once | INTRAVENOUS | Status: AC
  Administered 2021-11-16: 200 mg via INTRAVENOUS
  Filled 2021-11-16: qty 200

## 2021-11-16 NOTE — Patient Instructions (Signed)

## 2021-11-21 ENCOUNTER — Ambulatory Visit (HOSPITAL_COMMUNITY)
Admission: RE | Admit: 2021-11-21 | Discharge: 2021-11-21 | Disposition: A | Discharge status: Home / Self Care | Payer: Medicare PPO | Source: Ambulatory Visit | Attending: Oncology | Admitting: Oncology

## 2021-11-21 ENCOUNTER — Other Ambulatory Visit: Payer: Self-pay

## 2021-11-21 DIAGNOSIS — M19011 Primary osteoarthritis, right shoulder: Secondary | ICD-10-CM | POA: Diagnosis not present

## 2021-11-21 DIAGNOSIS — R634 Abnormal weight loss: Secondary | ICD-10-CM | POA: Diagnosis not present

## 2021-11-21 DIAGNOSIS — I700 Atherosclerosis of aorta: Secondary | ICD-10-CM | POA: Diagnosis not present

## 2021-11-21 DIAGNOSIS — D649 Anemia, unspecified: Secondary | ICD-10-CM | POA: Diagnosis not present

## 2021-11-21 DIAGNOSIS — M160 Bilateral primary osteoarthritis of hip: Secondary | ICD-10-CM | POA: Diagnosis not present

## 2021-11-21 DIAGNOSIS — R109 Unspecified abdominal pain: Secondary | ICD-10-CM | POA: Diagnosis not present

## 2021-11-21 DIAGNOSIS — K439 Ventral hernia without obstruction or gangrene: Secondary | ICD-10-CM | POA: Diagnosis not present

## 2021-11-21 LAB — POCT I-STAT CREATININE: Creatinine, Ser: 1 mg/dL (ref 0.44–1.00)

## 2021-11-21 MED ORDER — SODIUM CHLORIDE (PF) 0.9 % IJ SOLN
INTRAMUSCULAR | Status: AC
  Filled 2021-11-21: qty 50

## 2021-11-21 MED ORDER — IOHEXOL 300 MG/ML  SOLN
100.0000 mL | Freq: Once | INTRAMUSCULAR | Status: AC | PRN
  Administered 2021-11-21: 100 mL via INTRAVENOUS

## 2021-11-22 ENCOUNTER — Telehealth: Payer: Self-pay | Admitting: *Deleted

## 2021-11-22 ENCOUNTER — Inpatient Hospital Stay: Payer: Medicare PPO

## 2021-11-22 VITALS — BP 162/53 | HR 72 | Temp 98.3°F | Resp 18

## 2021-11-22 DIAGNOSIS — D508 Other iron deficiency anemias: Secondary | ICD-10-CM

## 2021-11-22 DIAGNOSIS — D509 Iron deficiency anemia, unspecified: Secondary | ICD-10-CM | POA: Diagnosis not present

## 2021-11-22 MED ORDER — SODIUM CHLORIDE 0.9 % IV SOLN: Freq: Once | INTRAVENOUS | Status: AC

## 2021-11-22 MED ORDER — SODIUM CHLORIDE 0.9 % IV SOLN
200.0000 mg | Freq: Once | INTRAVENOUS | Status: AC
  Administered 2021-11-22: 200 mg via INTRAVENOUS
  Filled 2021-11-22: qty 200

## 2021-11-22 NOTE — Telephone Encounter (Signed)
LM to call Dr Shadad's office °

## 2021-11-22 NOTE — Telephone Encounter (Signed)
-----   Message from Benjiman Core, MD sent at 11/22/2021  8:28 AM EST ----- Please let her know her scan shows no cancer or other abnormalities.

## 2021-11-22 NOTE — Patient Instructions (Signed)

## 2021-11-22 NOTE — Telephone Encounter (Signed)
Notified of Dr Shadad's message below 

## 2021-11-29 ENCOUNTER — Other Ambulatory Visit: Payer: Self-pay

## 2021-11-29 ENCOUNTER — Inpatient Hospital Stay: Payer: Medicare PPO

## 2021-11-29 VITALS — BP 173/53 | HR 66 | Temp 98.0°F | Resp 16 | Ht 64.0 in | Wt 176.0 lb

## 2021-11-29 DIAGNOSIS — D508 Other iron deficiency anemias: Secondary | ICD-10-CM

## 2021-11-29 DIAGNOSIS — D509 Iron deficiency anemia, unspecified: Secondary | ICD-10-CM | POA: Diagnosis not present

## 2021-11-29 MED ORDER — ALBUTEROL SULFATE (2.5 MG/3ML) 0.083% IN NEBU: 2.5000 mg | INHALATION_SOLUTION | Freq: Once | RESPIRATORY_TRACT | Status: DC | PRN

## 2021-11-29 MED ORDER — EPINEPHRINE 0.3 MG/0.3ML IJ SOAJ: 0.3000 mg | Freq: Once | INTRAMUSCULAR | Status: DC | PRN

## 2021-11-29 MED ORDER — SODIUM CHLORIDE 0.9 % IV SOLN: Freq: Once | INTRAVENOUS | Status: DC | PRN

## 2021-11-29 MED ORDER — METHYLPREDNISOLONE SODIUM SUCC 125 MG IJ SOLR: 125.0000 mg | Freq: Once | INTRAMUSCULAR | Status: DC | PRN

## 2021-11-29 MED ORDER — SODIUM CHLORIDE 0.9 % IV SOLN
200.0000 mg | Freq: Once | INTRAVENOUS | Status: AC
  Administered 2021-11-29: 200 mg via INTRAVENOUS
  Filled 2021-11-29: qty 200

## 2021-11-29 MED ORDER — DIPHENHYDRAMINE HCL 50 MG/ML IJ SOLN: 50.0000 mg | Freq: Once | INTRAMUSCULAR | Status: DC | PRN

## 2021-11-29 MED ORDER — HEPARIN SOD (PORK) LOCK FLUSH 100 UNIT/ML IV SOLN: 500.0000 [IU] | Freq: Once | INTRAVENOUS | Status: DC | PRN

## 2021-11-29 MED ORDER — HEPARIN SOD (PORK) LOCK FLUSH 100 UNIT/ML IV SOLN: 250.0000 [IU] | Freq: Once | INTRAVENOUS | Status: DC | PRN

## 2021-11-29 MED ORDER — ALTEPLASE 2 MG IJ SOLR: 2.0000 mg | Freq: Once | INTRAMUSCULAR | Status: DC | PRN

## 2021-11-29 MED ORDER — FAMOTIDINE 20 MG IN NS 100 ML IVPB: 20.0000 mg | Freq: Once | INTRAVENOUS | Status: DC | PRN

## 2021-11-29 MED ORDER — SODIUM CHLORIDE 0.9% FLUSH: 10.0000 mL | Freq: Once | INTRAVENOUS | Status: DC | PRN

## 2021-11-29 MED ORDER — SODIUM CHLORIDE 0.9 % IV SOLN: Freq: Once | INTRAVENOUS | Status: AC

## 2021-11-29 MED ORDER — SODIUM CHLORIDE 0.9% FLUSH: 3.0000 mL | Freq: Once | INTRAVENOUS | Status: DC | PRN

## 2021-11-29 NOTE — Patient Instructions (Signed)

## 2021-12-03 ENCOUNTER — Inpatient Hospital Stay: Payer: Medicare PPO | Admitting: Oncology

## 2021-12-03 ENCOUNTER — Other Ambulatory Visit: Payer: Self-pay

## 2021-12-03 VITALS — BP 186/76 | HR 78 | Temp 97.9°F | Resp 17 | Ht 64.0 in | Wt 177.0 lb

## 2021-12-03 DIAGNOSIS — D509 Iron deficiency anemia, unspecified: Secondary | ICD-10-CM | POA: Diagnosis not present

## 2021-12-03 DIAGNOSIS — D508 Other iron deficiency anemias: Secondary | ICD-10-CM | POA: Diagnosis not present

## 2021-12-03 NOTE — Progress Notes (Signed)
Hematology and Oncology Follow Up Visit  Gabriela Sanchez [id number] 04/27/1946 76 y.o. 12/03/2021 10:06 AM Nadene Rubins, [name], MDWile, [name], MD   Principle Diagnosis: 76 year old with iron deficiency anemia diagnosed in December 2022.  She was found to have a hemoglobin of 5, ferritin of 7 with abdominal imaging did not show any malignancy.   Current therapy:  IV iron infusion in the form of iron sucrose.  She received a total of 600 mg dosing.  Interim History: Ms. Wonder returns today for a follow-up visit.  Since the last visit, she reports feeling better after receiving iron sucrose without any complications.  She denies any nausea, vomiting or abdominal pain.  She denies any infusion related issues.  She denies any hematochezia or melena.  She is under evaluation to have a colonoscopy.     Medications: I have reviewed the patient's current medications.  Current Outpatient Medications  Medication Sig Dispense Refill   amLODipine (NORVASC) 5 MG tablet      aspirin 81 MG chewable tablet Chew by mouth.     CYANOCOBALAMIN PO Place under the tongue.     Glucosamine Sulfate 500 MG TABS Take by mouth.     ibuprofen (ADVIL,MOTRIN) 600 MG tablet Take 1 tablet (600 mg total) by mouth every 6 (six) hours as needed for pain. 30 tablet 0   Multiple Vitamin (MULTIVITAMIN WITH MINERALS) TABS Take 1 tablet by mouth daily.     omega-3 acid ethyl esters (LOVAZA) 1 G capsule Take 1 g by mouth daily.     No current facility-administered medications for this visit.     Allergies:  Allergies  Allergen Reactions   Ampicillin     REACTION: rash   Erythromycin Ethylsuccinate     REACTION: rash   Lisinopril Cough   Losartan Other (See Comments)    Chest tightness,sob,dizziness   Other Nausea Only   Simvastatin     REACTION: ARM PAIN   Sulfa Antibiotics Other (See Comments)    Gi upset   Sulfamethoxazole     REACTION: rash     Physical Exam: Blood pressure (!) 186/76, pulse 78,  temperature 97.9 F (36.6 C), temperature source Temporal, resp. rate 17, height 5\' 4"  (1.626 m), weight 177 lb (80.3 kg), SpO2 100 %.  ECOG: 1   General appearance: Comfortable appearing without any discomfort Head: Normocephalic without any trauma Oropharynx: Mucous membranes are moist and pink without any thrush or ulcers. Eyes: Pupils are equal and round reactive to light. Lymph nodes: No cervical, supraclavicular, inguinal or axillary lymphadenopathy.   Heart:regular rate and rhythm.  S1 and S2 without leg edema. Lung: Clear without any rhonchi or wheezes.  No dullness to percussion. Abdomin: Soft, nontender, nondistended with good bowel sounds.  No hepatosplenomegaly. Musculoskeletal: No joint deformity or effusion.  Full range of motion noted. Neurological: No deficits noted on motor, sensory and deep tendon reflex exam. Skin: No petechial rash or dryness.  Appeared moist.     Lab Results: Lab Results  Component Value Date   WBC 7.7 11/07/2021   HGB 5.2 (LL) 11/07/2021   HCT 20.0 (L) 11/07/2021   MCV 59.5 (L) 11/07/2021   PLT 425 (H) 11/07/2021     Chemistry      Component Value Date/Time   NA 140 05/23/2021 1104   K 4.8 05/23/2021 1104   CL 102 05/23/2021 1104   CO2 24 05/23/2021 1104   BUN 15 05/23/2021 1104   CREATININE 1.00 11/21/2021 1119  Component Value Date/Time   CALCIUM 9.4 05/23/2021 1104   ALKPHOS 76 02/09/2008 0922   AST 21 02/09/2008 0922   ALT 17 02/09/2008 0922   BILITOT 0.4 02/09/2008 0922         Impression and Plan:  76 year old with:  1.  Iron deficiency anemia presented with severe anemia with hemoglobin of 5 and ferritin of 7 in December 2022.  Etiology remains unclear likely related to poor GI absorption.    The differential diagnosis was reviewed again and management choices were discussed.  I have recommended continuing iron sucrose to complete 1000 mg and will reevaluate her counts in the next 2 to 3 months.   2.  Colon  cancer and GI malignancy screening: CT scan obtained on November 21, 2021 was personally reviewed and showed no evidence of malignancy.  I have recommended completing her work-up with a colonoscopy which she will schedule in the near future.  3.  Follow-up: In 2 months for repeat evaluation.   30  minutes were spent on this visit.  The time was dedicated to reviewing laboratory data, imaging studies and future plan of care discussion.  Eli Hose, MD 1/24/202310:06 AM

## 2021-12-06 ENCOUNTER — Other Ambulatory Visit: Payer: Self-pay

## 2021-12-06 ENCOUNTER — Inpatient Hospital Stay: Payer: Medicare PPO

## 2021-12-06 VITALS — BP 162/62 | HR 66 | Temp 98.2°F | Resp 18

## 2021-12-06 DIAGNOSIS — D509 Iron deficiency anemia, unspecified: Secondary | ICD-10-CM | POA: Diagnosis not present

## 2021-12-06 DIAGNOSIS — D508 Other iron deficiency anemias: Secondary | ICD-10-CM

## 2021-12-06 MED ORDER — SODIUM CHLORIDE 0.9 % IV SOLN
200.0000 mg | Freq: Once | INTRAVENOUS | Status: AC
  Administered 2021-12-06: 200 mg via INTRAVENOUS
  Filled 2021-12-06: qty 200

## 2021-12-06 MED ORDER — SODIUM CHLORIDE 0.9 % IV SOLN: Freq: Once | INTRAVENOUS | Status: AC

## 2021-12-06 NOTE — Patient Instructions (Signed)

## 2021-12-06 NOTE — Progress Notes (Signed)
Patient declined to stay for full 30 minute observation following administration of iron infusion. Patient observed for 15 minutes and discharged. Vital signs retaken and remained stable. Patient showed no signs of distress upon discharge.

## 2021-12-13 ENCOUNTER — Inpatient Hospital Stay: Payer: Medicare PPO | Attending: Oncology

## 2021-12-13 ENCOUNTER — Other Ambulatory Visit: Payer: Self-pay

## 2021-12-13 VITALS — BP 149/51 | HR 63 | Temp 97.9°F | Resp 20 | Ht 64.0 in | Wt 174.8 lb

## 2021-12-13 DIAGNOSIS — D508 Other iron deficiency anemias: Secondary | ICD-10-CM

## 2021-12-13 DIAGNOSIS — D509 Iron deficiency anemia, unspecified: Secondary | ICD-10-CM | POA: Insufficient documentation

## 2021-12-13 MED ORDER — SODIUM CHLORIDE 0.9 % IV SOLN: Freq: Once | INTRAVENOUS | Status: AC

## 2021-12-13 MED ORDER — SODIUM CHLORIDE 0.9 % IV SOLN
200.0000 mg | Freq: Once | INTRAVENOUS | Status: AC
  Administered 2021-12-13: 200 mg via INTRAVENOUS
  Filled 2021-12-13: qty 200

## 2021-12-13 NOTE — Patient Instructions (Signed)

## 2021-12-25 DIAGNOSIS — D509 Iron deficiency anemia, unspecified: Secondary | ICD-10-CM | POA: Diagnosis not present

## 2022-01-23 DIAGNOSIS — I7789 Other specified disorders of arteries and arterioles: Secondary | ICD-10-CM | POA: Diagnosis not present

## 2022-01-23 DIAGNOSIS — D509 Iron deficiency anemia, unspecified: Secondary | ICD-10-CM | POA: Diagnosis not present

## 2022-01-23 DIAGNOSIS — G729 Myopathy, unspecified: Secondary | ICD-10-CM | POA: Diagnosis not present

## 2022-01-23 DIAGNOSIS — I700 Atherosclerosis of aorta: Secondary | ICD-10-CM | POA: Diagnosis not present

## 2022-01-23 DIAGNOSIS — I1310 Hypertensive heart and chronic kidney disease without heart failure, with stage 1 through stage 4 chronic kidney disease, or unspecified chronic kidney disease: Secondary | ICD-10-CM | POA: Diagnosis not present

## 2022-01-23 DIAGNOSIS — N1831 Chronic kidney disease, stage 3a: Secondary | ICD-10-CM | POA: Diagnosis not present

## 2022-01-23 DIAGNOSIS — R7303 Prediabetes: Secondary | ICD-10-CM | POA: Diagnosis not present

## 2022-02-06 ENCOUNTER — Telehealth: Payer: Self-pay | Admitting: Oncology

## 2022-02-06 NOTE — Telephone Encounter (Signed)
Called patient regarding upcoming appointments, left a voicemail. 

## 2022-02-07 ENCOUNTER — Other Ambulatory Visit: Payer: Self-pay

## 2022-02-07 ENCOUNTER — Inpatient Hospital Stay: Payer: Medicare PPO | Attending: Oncology

## 2022-02-07 ENCOUNTER — Inpatient Hospital Stay: Payer: Medicare PPO | Admitting: Oncology

## 2022-02-07 VITALS — BP 179/53 | HR 79 | Temp 98.2°F | Resp 18 | Ht 64.0 in | Wt 180.4 lb

## 2022-02-07 DIAGNOSIS — D508 Other iron deficiency anemias: Secondary | ICD-10-CM

## 2022-02-07 DIAGNOSIS — Z79899 Other long term (current) drug therapy: Secondary | ICD-10-CM | POA: Insufficient documentation

## 2022-02-07 DIAGNOSIS — D509 Iron deficiency anemia, unspecified: Secondary | ICD-10-CM | POA: Diagnosis not present

## 2022-02-07 LAB — CBC WITH DIFFERENTIAL (CANCER CENTER ONLY)
Abs Immature Granulocytes: 0.02 10*3/uL (ref 0.00–0.07)
Basophils Absolute: 0.1 10*3/uL (ref 0.0–0.1)
Basophils Relative: 1 %
Eosinophils Absolute: 0.2 10*3/uL (ref 0.0–0.5)
Eosinophils Relative: 4 %
HCT: 27.7 % — ABNORMAL LOW (ref 36.0–46.0)
Hemoglobin: 8.4 g/dL — ABNORMAL LOW (ref 12.0–15.0)
Immature Granulocytes: 0 %
Lymphocytes Relative: 26 %
Lymphs Abs: 1.6 10*3/uL (ref 0.7–4.0)
MCH: 24.5 pg — ABNORMAL LOW (ref 26.0–34.0)
MCHC: 30.3 g/dL (ref 30.0–36.0)
MCV: 80.8 fL (ref 80.0–100.0)
Monocytes Absolute: 0.6 10*3/uL (ref 0.1–1.0)
Monocytes Relative: 9 %
Neutro Abs: 3.6 10*3/uL (ref 1.7–7.7)
Neutrophils Relative %: 60 %
Platelet Count: 267 10*3/uL (ref 150–400)
RBC: 3.43 MIL/uL — ABNORMAL LOW (ref 3.87–5.11)
RDW: 21.6 % — ABNORMAL HIGH (ref 11.5–15.5)
WBC Count: 6.1 10*3/uL (ref 4.0–10.5)
nRBC: 0 % (ref 0.0–0.2)

## 2022-02-07 LAB — FERRITIN: Ferritin: 9 ng/mL — ABNORMAL LOW (ref 11–307)

## 2022-02-07 LAB — IRON AND IRON BINDING CAPACITY (CC-WL,HP ONLY)
Iron: 17 ug/dL — ABNORMAL LOW (ref 28–170)
Saturation Ratios: 4 % — ABNORMAL LOW (ref 10.4–31.8)
TIBC: 448 ug/dL (ref 250–450)
UIBC: 431 ug/dL (ref 148–442)

## 2022-02-07 NOTE — Progress Notes (Signed)
Hematology and Oncology Follow Up Visit ? ?Gabriela Sanchez ?[id number] ?Apr 17, 1946 76 y.o. ?02/07/2022 9:52 AM ?Gabriela Sanchez, MDWile, Gabriela Cowden, MD  ? ?Principle Diagnosis: 76 year old with iron deficiency anemia of unclear etiology without any evidence of GI malignancy diagnosed in December 2022.   ? ?Prior therapy: IV iron (iron sucrose) for a total of 1000 mg completed on December 13, 2021. ? ? ? ?Current therapy: Active surveillance with repeat intravenous iron as needed. ? ? ? ?Interim History: Gabriela Sanchez is here for repeat follow-up visit.  Since last visit, she completed IV iron infusion without any complications.  She reported improvement in her performance status and activity level.  She denies any chest pain or shortness of breath.  She denies any arthralgias or myalgias.  She denies any infusion related complications. ? ? ? ? ?Medications: Updated on review. ?Current Outpatient Medications  ?Medication Sig Dispense Refill  ? amLODipine (NORVASC) 5 MG tablet     ? aspirin 81 MG chewable tablet Chew by mouth.    ? CYANOCOBALAMIN PO Place under the tongue.    ? Glucosamine Sulfate 500 MG TABS Take by mouth.    ? ibuprofen (ADVIL,MOTRIN) 600 MG tablet Take 1 tablet (600 mg total) by mouth every 6 (six) hours as needed for pain. 30 tablet 0  ? Multiple Vitamin (MULTIVITAMIN WITH MINERALS) TABS Take 1 tablet by mouth daily.    ? omega-3 acid ethyl esters (LOVAZA) 1 G capsule Take 1 g by mouth daily.    ? ?No current facility-administered medications for this visit.  ? ? ? ?Allergies:  ?Allergies  ?Allergen Reactions  ? Ampicillin   ?  REACTION: rash  ? Erythromycin Ethylsuccinate   ?  REACTION: rash  ? Lisinopril Cough  ? Losartan Other (See Comments)  ?  Chest tightness,sob,dizziness  ? Other Nausea Only  ? Simvastatin   ?  REACTION: ARM PAIN  ? Sulfa Antibiotics Other (See Comments)  ?  Gi upset  ? Sulfamethoxazole   ?  REACTION: rash  ? ? ? ?Physical Exam: ?Blood pressure (!) 179/53, pulse 79, temperature 98.2  ?F (36.8 ?C), temperature source Temporal, resp. rate 18, height 5\' 4"  (1.626 m), weight 180 lb 6.4 oz (81.8 kg), SpO2 100 %. ? ? ?ECOG: 1 ? ? ? ?General appearance: Alert, awake without any distress. ?Head: Atraumatic without abnormalities ?Oropharynx: Without any thrush or ulcers. ?Eyes: No scleral icterus. ?Lymph nodes: No lymphadenopathy noted in the cervical, supraclavicular, or axillary nodes ?Heart:regular rate and rhythm, without any murmurs or gallops.   ?Lung: Clear to auscultation without any rhonchi, wheezes or dullness to percussion. ?Abdomin: Soft, nontender without any shifting dullness or ascites. ?Musculoskeletal: No clubbing or cyanosis. ?Neurological: No motor or sensory deficits. ?Skin: No rashes or lesions. ?Psychiatric: Mood and affect appeared normal. ? ? ? ?Lab Results: ?Lab Results  ?Component Value Date  ? WBC 7.7 11/07/2021  ? HGB 5.2 (LL) 11/07/2021  ? HCT 20.0 (L) 11/07/2021  ? MCV 59.5 (L) 11/07/2021  ? PLT 425 (H) 11/07/2021  ? ?  Chemistry   ?   ?Component Value Date/Time  ? NA 140 05/23/2021 1104  ? K 4.8 05/23/2021 1104  ? CL 102 05/23/2021 1104  ? CO2 24 05/23/2021 1104  ? BUN 15 05/23/2021 1104  ? CREATININE 1.00 11/21/2021 1119  ?    ?Component Value Date/Time  ? CALCIUM 9.4 05/23/2021 1104  ? ALKPHOS 76 02/09/2008 0922  ? AST 21 02/09/2008 0922  ? ALT 17  02/09/2008 6314  ? BILITOT 0.4 02/09/2008 9702  ?  ? ? ? ? ? ?Impression and Plan: ? ?76 year old with: ? ?1.  Iron deficiency anemia diagnosed in December 2022.  Etiology is likely related to poor iron absorption without any clear-cut source of blood loss.   ? ?She is status post IV iron infusion that was completed in February 2023 without any complications.  Risks and benefits of repeating intravenous iron infusion were discussed at this time.  Complications that include arthralgias, myalgias and rarely anaphylaxis were reiterated.  We will await the results of iron studies today and make a determination. ? ? ? ?Laboratory  data from today reviewed including a CBC that is improved but still relatively low at 8.4 with elevated RDW.  Alternative options to IV iron including oral iron therapy were reiterated.  At this time, she opted to proceed with oral iron therapy instead of repeat intravenous iron.  I recommended continuing this for the time being and recheck her counts in 4 months. ?  ?2.  Colon cancer and GI malignancy screening: CT scan in January 2023 did not show any clear-cut malignancy.  I recommended completing her work-up with a colonoscopy and endoscopy. ? ?3.  Follow-up: In 4 months for repeat evaluation. ?  ?30  minutes were dedicated to this encounter.  The time was spent on reviewing laboratory data, disease status update and outlining future plan of care discussion. ? ?Gabriela Hose, MD ?3/31/20239:52 AM ? ?

## 2022-02-10 ENCOUNTER — Telehealth: Payer: Self-pay

## 2022-02-10 ENCOUNTER — Telehealth: Payer: Self-pay | Admitting: Oncology

## 2022-02-10 NOTE — Telephone Encounter (Signed)
Per 3/31 los Left message with date and time. ?

## 2022-02-10 NOTE — Telephone Encounter (Signed)
T/C from pt questioning her 4 mo f/up appt since her Hgb is still low.  Advised pt per last office note she was to take the oral iron and we would re check her labs after she had been on the meds for an appropriate amount of time. ? ?Pt verbalized understanding. ?

## 2022-05-07 ENCOUNTER — Telehealth: Payer: Self-pay | Admitting: Oncology

## 2022-05-07 NOTE — Telephone Encounter (Signed)
Called patient regarding upcoming August appointment, patient has been called and voicemail was left. 

## 2022-05-12 ENCOUNTER — Encounter (HOSPITAL_COMMUNITY): Payer: Self-pay

## 2022-05-12 ENCOUNTER — Emergency Department (HOSPITAL_COMMUNITY)
Admission: EM | Admit: 2022-05-12 | Discharge: 2022-05-13 | Disposition: A | Discharge status: Home / Self Care | Payer: Medicare PPO | Attending: Emergency Medicine | Admitting: Emergency Medicine

## 2022-05-12 ENCOUNTER — Telehealth: Payer: Self-pay

## 2022-05-12 ENCOUNTER — Other Ambulatory Visit: Payer: Self-pay

## 2022-05-12 DIAGNOSIS — D649 Anemia, unspecified: Secondary | ICD-10-CM | POA: Diagnosis not present

## 2022-05-12 DIAGNOSIS — Z7982 Long term (current) use of aspirin: Secondary | ICD-10-CM | POA: Insufficient documentation

## 2022-05-12 DIAGNOSIS — R799 Abnormal finding of blood chemistry, unspecified: Secondary | ICD-10-CM | POA: Diagnosis present

## 2022-05-12 HISTORY — DX: Anemia, unspecified: D64.9

## 2022-05-12 LAB — CBC
HCT: 24.9 % — ABNORMAL LOW (ref 36.0–46.0)
Hemoglobin: 6.8 g/dL — CL (ref 12.0–15.0)
MCH: 18.4 pg — ABNORMAL LOW (ref 26.0–34.0)
MCHC: 27.3 g/dL — ABNORMAL LOW (ref 30.0–36.0)
MCV: 67.5 fL — ABNORMAL LOW (ref 80.0–100.0)
Platelets: 367 10*3/uL (ref 150–400)
RBC: 3.69 MIL/uL — ABNORMAL LOW (ref 3.87–5.11)
RDW: 18.5 % — ABNORMAL HIGH (ref 11.5–15.5)
WBC: 7.2 10*3/uL (ref 4.0–10.5)
nRBC: 0 % (ref 0.0–0.2)

## 2022-05-12 LAB — BASIC METABOLIC PANEL
Anion gap: 9 (ref 5–15)
BUN: 24 mg/dL — ABNORMAL HIGH (ref 8–23)
CO2: 25 mmol/L (ref 22–32)
Calcium: 8.9 mg/dL (ref 8.9–10.3)
Chloride: 105 mmol/L (ref 98–111)
Creatinine, Ser: 1 mg/dL (ref 0.44–1.00)
GFR, Estimated: 58 mL/min — ABNORMAL LOW (ref >60)
Glucose, Bld: 100 mg/dL — ABNORMAL HIGH (ref 70–99)
Potassium: 4.2 mmol/L (ref 3.5–5.1)
Sodium: 139 mmol/L (ref 135–145)

## 2022-05-12 LAB — PREPARE RBC (CROSSMATCH)

## 2022-05-12 LAB — POC OCCULT BLOOD, ED: Fecal Occult Bld: NEGATIVE

## 2022-05-12 LAB — ABO/RH: ABO/RH(D): O POS

## 2022-05-12 MED ORDER — SODIUM CHLORIDE 0.9 % IV SOLN
10.0000 mL/h | Freq: Once | INTRAVENOUS | Status: AC
  Administered 2022-05-12: 10 mL/h via INTRAVENOUS

## 2022-05-12 NOTE — Telephone Encounter (Signed)
Patioent called late in the day to say she had received a phone call from PCP - Dr. Nadene Rubins say that HGB was 6.9. Reviewed telephone call with Renita Papa PA. Patient advised to go to ED for evaluation. I explained safety rational to pt and need to be seen prior to office reopening on 05/14/22.  Routed to provider.

## 2022-05-12 NOTE — ED Provider Triage Note (Signed)
Emergency Medicine Provider Triage Evaluation Note  Gabriela Sanchez , a 76 y.o. female  was evaluated in triage.  Pt complains of low hemoglobin.  Patient states she had labs drawn on 6/30 and was called today stating that her hemoglobin was 6.  Patient states that she has history of anemia and low hemoglobin requiring transfusion.  Patient states that she was here "every Friday in February for transfusions".  Patient states that at this time her hemoglobin was 5.6.  Patient endorsing weakness along with slight shortness of breath.  Patient states that she typically takes her trash cans down to the curb but has been unable to do so recently.  Patient denies any blood in stool, blood in urine, being on blood thinners.  Patient states that she takes iron supplementation.  Review of Systems  Positive:  Negative:   Physical Exam  BP (!) 166/59 (BP Location: Left Arm)   Pulse 73   Temp 98.1 F (36.7 C) (Oral)   Ht 5\' 4"  (1.626 m)   Wt 83.9 kg   SpO2 100%   BMI 31.76 kg/m  Gen:   Awake, no distress   Resp:  Normal effort  MSK:   Moves extremities without difficulty  Other:  Lung sounds clear.  Medical Decision Making  Medically screening exam initiated at 6:26 PM.  Appropriate orders placed.  Kiki Bivens Boursiquot was informed that the remainder of the evaluation will be completed by another provider, this initial triage assessment does not replace that evaluation, and the importance of remaining in the ED until their evaluation is complete.    Jasmine Pang, PA-C 05/12/22 1827

## 2022-05-12 NOTE — ED Triage Notes (Signed)
Patient reports that she was called by her PCP today and told her her Hgb was around 6. Patient denies any obvious bleeding.

## 2022-05-12 NOTE — ED Provider Notes (Signed)
[facility]-EMERGENCY DEPT Provider Note   CSN: [id number] Arrival date & time: 05/12/22  1736     History  Chief Complaint  Patient presents with   Abnormal Lab    Gabriela Sanchez is a 75 y.o. female.  76 year old female presents due to low hemoglobin.  Patient has a history of anemia of unknown etiology.  Has had 5 blood transfusions this year.  Is unsure what her hemoglobin normally runs.  Has been noting increasing weakness as well as dyspnea on exertion.  Was seen by her PCP for yearly checkup 3 days ago and had blood work done and was called today and was told her hemoglobin is 6.  Patient denies any blood in her stool.  She has had no bloody emesis.  Is on iron therapy at this time.       Home Medications Prior to Admission medications   Medication Sig Start Date End Date Taking? Authorizing Provider  amLODipine (NORVASC) 5 MG tablet  02/26/20   [provider]  aspirin 81 MG chewable tablet Chew by mouth.    [provider]  CYANOCOBALAMIN PO Place under the tongue.    [provider]  Glucosamine Sulfate 500 MG TABS Take by mouth.    [provider]  ibuprofen (ADVIL,MOTRIN) 600 MG tablet Take 1 tablet (600 mg total) by mouth every 6 (six) hours as needed for pain. 02/07/13   Lorre Nick, MD  Multiple Vitamin (MULTIVITAMIN WITH MINERALS) TABS Take 1 tablet by mouth daily.    [provider]  omega-3 acid ethyl esters (LOVAZA) 1 G capsule Take 1 g by mouth daily.    [provider]      Allergies    Ampicillin, Erythromycin ethylsuccinate, Lisinopril, Losartan, Other, Simvastatin, Sulfa antibiotics, and Sulfamethoxazole    Review of Systems   Review of Systems  All other systems reviewed and are negative.   Physical Exam Updated Vital Signs BP (!) 166/59 (BP Location: Left Arm)   Pulse 73   Temp 98.1 F (36.7 C) (Oral)   Ht 1.626 m (5\' 4" )   Wt 83.9 kg   SpO2 100%   BMI 31.76 kg/m   Physical Exam Vitals and nursing note reviewed.  Constitutional:      General: She is not in acute distress.    Appearance: Normal appearance. She is well-developed. She is not toxic-appearing.  HENT:     Head: Normocephalic and atraumatic.  Eyes:     General: Lids are normal.     Conjunctiva/sclera: Conjunctivae normal.     Pupils: Pupils are equal, round, and reactive to light.  Neck:     Thyroid: No thyroid mass.     Trachea: No tracheal deviation.  Cardiovascular:     Rate and Rhythm: Normal rate and regular rhythm.     Heart sounds: Normal heart sounds. No murmur heard.    No gallop.  Pulmonary:     Effort: Pulmonary effort is normal. No respiratory distress.     Breath sounds: Normal breath sounds. No stridor. No decreased breath sounds, wheezing, rhonchi or rales.  Abdominal:     General: There is no distension.     Palpations: Abdomen is soft.     Tenderness: There is no abdominal tenderness. There is no rebound.  Musculoskeletal:        General: No tenderness. Normal range of motion.     Cervical back: Normal range of motion and neck supple.  Skin:  General: Skin is warm and dry.     Findings: No abrasion or rash.  Neurological:     Mental Status: She is alert and oriented to person, place, and time. Mental status is at baseline.     GCS: GCS eye subscore is 4. GCS verbal subscore is 5. GCS motor subscore is 6.     Cranial Nerves: No cranial nerve deficit.     Sensory: No sensory deficit.     Motor: Motor function is intact.  Psychiatric:        Attention and Perception: Attention normal.        Speech: Speech normal.        Behavior: Behavior normal.     ED Results / Procedures / Treatments   Labs (all labs ordered are listed, but only abnormal results are displayed) Labs Reviewed  CBC  BASIC METABOLIC PANEL  POC OCCULT BLOOD, ED  TYPE AND SCREEN    EKG None  Radiology No results found.  Procedures Procedures    Medications Ordered in  ED Medications - No data to display  ED Course/ Medical Decision Making/ A&P                           Medical Decision Making Amount and/or Complexity of Data Reviewed Labs: ordered.  Risk Prescription drug management.   Patient presents with symptomatic anemia with hemoglobin 6.8.  She has had history of this before in the past.  Patient is renal function is normal.  We will transfuse with 2 units of packed red blood cells here and discharged per her request.  She has medication for admission at this time.  She is hemodynamically stable.  Will sign out to next provider        Final Clinical Impression(s) / ED Diagnoses Final diagnoses:  None    Rx / DC Orders ED Discharge Orders     None         Lorre Nick, MD 05/12/22 2146

## 2022-05-14 LAB — BPAM RBC
Blood Product Expiration Date: 202307262359
Blood Product Expiration Date: 202307262359
ISSUE DATE / TIME: 202307032135
ISSUE DATE / TIME: 202307040011
Unit Type and Rh: 5100
Unit Type and Rh: 5100

## 2022-05-14 LAB — TYPE AND SCREEN
ABO/RH(D): O POS
Antibody Screen: NEGATIVE
Unit division: 0
Unit division: 0

## 2022-05-16 ENCOUNTER — Encounter: Payer: Self-pay | Admitting: *Deleted

## 2022-05-16 ENCOUNTER — Other Ambulatory Visit: Payer: Self-pay | Admitting: *Deleted

## 2022-05-16 DIAGNOSIS — D508 Other iron deficiency anemias: Secondary | ICD-10-CM

## 2022-05-19 ENCOUNTER — Telehealth: Payer: Self-pay | Admitting: Oncology

## 2022-05-19 NOTE — Telephone Encounter (Signed)
.  Called patient to schedule appointment per 7/10 inbasket, patient is aware of date and time.   

## 2022-05-20 ENCOUNTER — Other Ambulatory Visit: Payer: Self-pay

## 2022-05-20 ENCOUNTER — Other Ambulatory Visit: Payer: Medicare PPO

## 2022-05-20 ENCOUNTER — Other Ambulatory Visit: Payer: Self-pay | Admitting: Oncology

## 2022-05-20 ENCOUNTER — Inpatient Hospital Stay: Payer: Medicare PPO | Attending: Oncology

## 2022-05-20 DIAGNOSIS — Z79899 Other long term (current) drug therapy: Secondary | ICD-10-CM | POA: Diagnosis not present

## 2022-05-20 DIAGNOSIS — D508 Other iron deficiency anemias: Secondary | ICD-10-CM

## 2022-05-20 DIAGNOSIS — D509 Iron deficiency anemia, unspecified: Secondary | ICD-10-CM | POA: Diagnosis not present

## 2022-05-20 LAB — CBC WITH DIFFERENTIAL (CANCER CENTER ONLY)
Abs Immature Granulocytes: 0.02 10*3/uL (ref 0.00–0.07)
Basophils Absolute: 0.1 10*3/uL (ref 0.0–0.1)
Basophils Relative: 1 %
Eosinophils Absolute: 0.2 10*3/uL (ref 0.0–0.5)
Eosinophils Relative: 3 %
HCT: 34.2 % — ABNORMAL LOW (ref 36.0–46.0)
Hemoglobin: 10.3 g/dL — ABNORMAL LOW (ref 12.0–15.0)
Immature Granulocytes: 0 %
Lymphocytes Relative: 28 %
Lymphs Abs: 1.7 10*3/uL (ref 0.7–4.0)
MCH: 21.8 pg — ABNORMAL LOW (ref 26.0–34.0)
MCHC: 30.1 g/dL (ref 30.0–36.0)
MCV: 72.3 fL — ABNORMAL LOW (ref 80.0–100.0)
Monocytes Absolute: 0.5 10*3/uL (ref 0.1–1.0)
Monocytes Relative: 8 %
Neutro Abs: 3.7 10*3/uL (ref 1.7–7.7)
Neutrophils Relative %: 60 %
Platelet Count: 366 10*3/uL (ref 150–400)
RBC: 4.73 MIL/uL (ref 3.87–5.11)
RDW: 22.8 % — ABNORMAL HIGH (ref 11.5–15.5)
WBC Count: 6.2 10*3/uL (ref 4.0–10.5)
nRBC: 0 % (ref 0.0–0.2)

## 2022-05-20 LAB — SAMPLE TO BLOOD BANK

## 2022-05-20 LAB — IRON AND IRON BINDING CAPACITY (CC-WL,HP ONLY)
Iron: 28 ug/dL (ref 28–170)
Saturation Ratios: 6 % — ABNORMAL LOW (ref 10.4–31.8)
TIBC: 469 ug/dL — ABNORMAL HIGH (ref 250–450)
UIBC: 441 ug/dL (ref 148–442)

## 2022-05-20 LAB — FERRITIN: Ferritin: 6 ng/mL — ABNORMAL LOW (ref 11–307)

## 2022-05-22 ENCOUNTER — Telehealth: Payer: Self-pay | Admitting: Oncology

## 2022-05-22 NOTE — Telephone Encounter (Signed)
.  Called patient to schedule appointment per 7/11 inbasket, patient is aware of date and time.   

## 2022-05-23 ENCOUNTER — Inpatient Hospital Stay: Payer: Medicare PPO

## 2022-05-23 ENCOUNTER — Other Ambulatory Visit: Payer: Self-pay

## 2022-05-23 VITALS — BP 170/59 | HR 69 | Temp 97.7°F | Resp 16

## 2022-05-23 DIAGNOSIS — D509 Iron deficiency anemia, unspecified: Secondary | ICD-10-CM | POA: Diagnosis not present

## 2022-05-23 DIAGNOSIS — D508 Other iron deficiency anemias: Secondary | ICD-10-CM

## 2022-05-23 MED ORDER — SODIUM CHLORIDE 0.9 % IV SOLN
200.0000 mg | Freq: Once | INTRAVENOUS | Status: AC
  Administered 2022-05-23: 200 mg via INTRAVENOUS
  Filled 2022-05-23: qty 200

## 2022-05-23 MED ORDER — SODIUM CHLORIDE 0.9 % IV SOLN: Freq: Once | INTRAVENOUS | Status: AC

## 2022-05-23 NOTE — Patient Instructions (Signed)

## 2022-05-28 ENCOUNTER — Telehealth: Payer: Self-pay | Admitting: *Deleted

## 2022-05-28 NOTE — Telephone Encounter (Signed)
-----   Message from Benjiman Core, MD sent at 05/27/2022  7:10 AM EDT ----- Yes ----- Message ----- From: Arville Care, RN Sent: 05/26/2022   2:36 PM EDT To: Phillis Knack, RN; Benjiman Core, MD  Ms Afzal called & is asking if she should continue taking po iron since she is receiving iron infusions.  Please advise.  Thanks, Darel Hong

## 2022-05-28 NOTE — Telephone Encounter (Signed)
Returned PC to patient, informed her of Dr. Alver Fisher advice as below, also instructed to take her oral iron with a source of Vitamin C, such as orange juice, to help absorption.  Patient verbalizes understanding.

## 2022-05-30 ENCOUNTER — Inpatient Hospital Stay: Payer: Medicare PPO

## 2022-05-30 ENCOUNTER — Other Ambulatory Visit: Payer: Self-pay

## 2022-05-30 VITALS — BP 167/52 | HR 62 | Temp 98.5°F | Resp 16

## 2022-05-30 DIAGNOSIS — D508 Other iron deficiency anemias: Secondary | ICD-10-CM

## 2022-05-30 DIAGNOSIS — D509 Iron deficiency anemia, unspecified: Secondary | ICD-10-CM | POA: Diagnosis not present

## 2022-05-30 MED ORDER — SODIUM CHLORIDE 0.9 % IV SOLN: Freq: Once | INTRAVENOUS | Status: AC

## 2022-05-30 MED ORDER — SODIUM CHLORIDE 0.9 % IV SOLN
200.0000 mg | Freq: Once | INTRAVENOUS | Status: AC
  Administered 2022-05-30: 200 mg via INTRAVENOUS
  Filled 2022-05-30: qty 200

## 2022-05-30 NOTE — Progress Notes (Signed)
Pt observed for 30 minutes post Venofer infusion. Pt tolerated trtmt well w/out incident. VSS at discharge.  Ambulatory to lobby.   

## 2022-05-30 NOTE — Patient Instructions (Signed)

## 2022-06-06 ENCOUNTER — Inpatient Hospital Stay: Payer: Medicare PPO

## 2022-06-06 ENCOUNTER — Other Ambulatory Visit: Payer: Self-pay

## 2022-06-06 VITALS — BP 150/66 | HR 67 | Temp 98.5°F | Resp 16

## 2022-06-06 DIAGNOSIS — D508 Other iron deficiency anemias: Secondary | ICD-10-CM

## 2022-06-06 DIAGNOSIS — D509 Iron deficiency anemia, unspecified: Secondary | ICD-10-CM | POA: Diagnosis not present

## 2022-06-06 MED ORDER — SODIUM CHLORIDE 0.9 % IV SOLN
200.0000 mg | Freq: Once | INTRAVENOUS | Status: AC
  Administered 2022-06-06: 200 mg via INTRAVENOUS
  Filled 2022-06-06: qty 200

## 2022-06-06 MED ORDER — SODIUM CHLORIDE 0.9 % IV SOLN: Freq: Once | INTRAVENOUS | Status: AC

## 2022-06-06 NOTE — Patient Instructions (Signed)

## 2022-06-06 NOTE — Progress Notes (Signed)
Patient tolerated Iron infusion. VSS. She declined to stay for 30 minute observation. Patient discharged to lobby with husband ambulatory.

## 2022-06-13 ENCOUNTER — Other Ambulatory Visit: Payer: Self-pay

## 2022-06-13 ENCOUNTER — Inpatient Hospital Stay: Payer: Medicare PPO | Attending: Oncology | Admitting: Oncology

## 2022-06-13 ENCOUNTER — Other Ambulatory Visit: Payer: Medicare PPO

## 2022-06-13 ENCOUNTER — Inpatient Hospital Stay: Payer: Medicare PPO

## 2022-06-13 VITALS — BP 244/74 | HR 57 | Temp 97.3°F | Resp 15 | Wt 186.9 lb

## 2022-06-13 VITALS — BP 204/57 | HR 53 | Resp 16

## 2022-06-13 DIAGNOSIS — D509 Iron deficiency anemia, unspecified: Secondary | ICD-10-CM | POA: Insufficient documentation

## 2022-06-13 DIAGNOSIS — Z79899 Other long term (current) drug therapy: Secondary | ICD-10-CM | POA: Insufficient documentation

## 2022-06-13 DIAGNOSIS — D508 Other iron deficiency anemias: Secondary | ICD-10-CM

## 2022-06-13 MED ORDER — SODIUM CHLORIDE 0.9 % IV SOLN
200.0000 mg | Freq: Once | INTRAVENOUS | Status: AC
  Administered 2022-06-13: 200 mg via INTRAVENOUS
  Filled 2022-06-13: qty 200

## 2022-06-13 MED ORDER — SODIUM CHLORIDE 0.9 % IV SOLN: Freq: Once | INTRAVENOUS | Status: AC

## 2022-06-13 NOTE — Progress Notes (Signed)
Hematology and Oncology Follow Up Visit  Gabriela Sanchez [id number] 10-27-1946 76 y.o. 06/13/2022 9:36 AM Gabriela Sanchez, [name], MDWile, [name], MD   Principle Diagnosis: 77 year old with iron deficiency anemia diagnosed in December 2022.  This likely related to poor iron absorption orally without any evidence of malignancy.      Current therapy:   IV iron replacement as needed.  She is currently receiving Venofer to complete 1000 mg total dose.    Interim History: [name] presents today for a repeat evaluation.  Since the last visit, she has not tolerated iron infusion without any major complaints.  Patient denies any nausea vomiting or abdominal pain.  Her fatigue has improved.  She denies any hematochezia or melena.  She stopped her blood pressure medication because of concerns regarding absorption of iron.     Medications: Reviewed without changes. Current Outpatient Medications  Medication Sig Dispense Refill   amLODipine (NORVASC) 10 MG tablet Take 10 mg by mouth daily. (Patient not taking: Reported on 06/06/2022)     aspirin 81 MG chewable tablet Chew 81 mg by mouth daily. (Patient not taking: Reported on 06/06/2022)     CYANOCOBALAMIN PO Place 1 tablet under the tongue daily. (Patient not taking: Reported on 06/06/2022)     Glucosamine Sulfate 500 MG TABS Take 500 mg by mouth daily. (Patient not taking: Reported on 06/06/2022)     hydrochlorothiazide (MICROZIDE) 12.5 MG capsule Take 12.5 mg by mouth every morning. (Patient not taking: Reported on 06/06/2022)     ibuprofen (ADVIL,MOTRIN) 600 MG tablet Take 1 tablet (600 mg total) by mouth every 6 (six) hours as needed for pain. 30 tablet 0   iron polysaccharides (NIFEREX) 150 MG capsule Take 150 mg by mouth 2 (two) times daily.     Multiple Vitamin (MULTIVITAMIN WITH MINERALS) TABS Take 1 tablet by mouth daily. (Patient not taking: Reported on 06/06/2022)     omega-3 acid ethyl esters (LOVAZA) 1 G capsule Take 1 g by mouth daily.  (Patient not taking: Reported on 06/06/2022)     No current facility-administered medications for this visit.     Allergies:  Allergies  Allergen Reactions   Ampicillin     REACTION: rash   Erythromycin Ethylsuccinate     REACTION: rash   Lisinopril Cough   Losartan Other (See Comments)    Chest tightness,sob,dizziness   Other Nausea Only   Simvastatin     REACTION: ARM PAIN   Sulfa Antibiotics Other (See Comments)    Gi upset   Sulfamethoxazole     REACTION: rash   Penicillin G Rash     Physical Exam:  Blood pressure (!) 244/74, pulse (!) 57, temperature (!) 97.3 F (36.3 C), temperature source Temporal, resp. rate 15, weight 186 lb 14.4 oz (84.8 kg), SpO2 100 %.   ECOG: 1   General appearance: Comfortable appearing without any discomfort Head: Normocephalic without any trauma Oropharynx: Mucous membranes are moist and pink without any thrush or ulcers. Eyes: Pupils are equal and round reactive to light. Lymph nodes: No cervical, supraclavicular, inguinal or axillary lymphadenopathy.   Heart:regular rate and rhythm.  S1 and S2 without leg edema. Lung: Clear without any rhonchi or wheezes.  No dullness to percussion. Abdomin: Soft, nontender, nondistended with good bowel sounds.  No hepatosplenomegaly. Musculoskeletal: No joint deformity or effusion.  Full range of motion noted. Neurological: No deficits noted on motor, sensory and deep tendon reflex exam. Skin: No petechial rash or dryness.  Appeared moist.  Lab Results: Lab Results  Component Value Date   WBC 6.2 05/20/2022   HGB 10.3 (L) 05/20/2022   HCT 34.2 (L) 05/20/2022   MCV 72.3 (L) 05/20/2022   PLT 366 05/20/2022     Chemistry      Component Value Date/Time   NA 139 05/12/2022 1851   NA 140 05/23/2021 1104   K 4.2 05/12/2022 1851   CL 105 05/12/2022 1851   CO2 25 05/12/2022 1851   BUN 24 (H) 05/12/2022 1851   BUN 15 05/23/2021 1104   CREATININE 1.00 05/12/2022 1851      Component  Value Date/Time   CALCIUM 8.9 05/12/2022 1851   ALKPHOS 76 02/09/2008 0922   AST 21 02/09/2008 0922   ALT 17 02/09/2008 0922   BILITOT 0.4 02/09/2008 0922         Impression and Plan:  76 year old with:  1.  Iron deficiency anemia related to poor iron absorption diagnosed in December 2022.      She currently receiving intravenous iron infusion starting on May 23, 2022.  Laboratory data on July 11 showed hemoglobin of 10.3 with saturation of 6% and low ferritin of 6.  Risks and benefits of continuing intravenous iron were discussed.  I anticipate improvement in her iron stores in the near future and will continue to monitor regularly and repeat iron infusion as needed.      2.  Colon cancer and GI malignancy screening: No evidence of malignancy noted at this time based on imaging studies.  I recommended GI evaluation.  3.  Hypertension: She can stop taking Norvasc and hydrochlorothiazide.  I urged her to restart those and contact her primary care physician regarding switching any of these medication.  3.  Follow-up: She will return in 4 months for follow-up.   30  minutes were were spent on this visit.  The time was dedicated to reviewing laboratory data, disease status update and outlining future plan of care discussion.  Eli Hose, MD 8/4/20239:36 AM

## 2022-06-13 NOTE — Progress Notes (Signed)
Blood pressure elevated and Dr. Clelia Croft aware. She stopped Norvasc. She will resume bo medication and contact PCP.

## 2022-06-13 NOTE — Patient Instructions (Signed)

## 2022-06-20 ENCOUNTER — Other Ambulatory Visit: Payer: Self-pay

## 2022-06-20 ENCOUNTER — Inpatient Hospital Stay: Payer: Medicare PPO

## 2022-06-20 VITALS — BP 172/68 | HR 68 | Temp 98.2°F | Resp 18

## 2022-06-20 DIAGNOSIS — D508 Other iron deficiency anemias: Secondary | ICD-10-CM

## 2022-06-20 DIAGNOSIS — D509 Iron deficiency anemia, unspecified: Secondary | ICD-10-CM | POA: Diagnosis not present

## 2022-06-20 MED ORDER — SODIUM CHLORIDE 0.9 % IV SOLN: Freq: Once | INTRAVENOUS | Status: AC

## 2022-06-20 MED ORDER — SODIUM CHLORIDE 0.9 % IV SOLN
200.0000 mg | Freq: Once | INTRAVENOUS | Status: AC
  Administered 2022-06-20: 200 mg via INTRAVENOUS
  Filled 2022-06-20: qty 200

## 2022-06-20 NOTE — Progress Notes (Signed)
Patient declined waiting her post iron observation. Vss at discharge.

## 2022-06-20 NOTE — Patient Instructions (Signed)

## 2022-07-16 ENCOUNTER — Telehealth: Payer: Self-pay

## 2022-07-16 ENCOUNTER — Telehealth: Payer: Self-pay | Admitting: *Deleted

## 2022-07-16 NOTE — Telephone Encounter (Signed)
   Pre-operative Risk Assessment    Patient Name: Gabriela Sanchez  DOB: August 30, 1946 MRN: [id number]      Request for Surgical Clearance    Procedure:   Right shoulder scope, rotator cuff repair   Date of Surgery:  Clearance TBD                                 Surgeon:  Dr. Frederico Hamman Surgeon's Group or Practice Name:  [facility]  Phone number:  [phone] Fax number:  [phone]   Type of Clearance Requested:   - Medical    Type of Anesthesia:  General    Additional requests/questions:    SignedDorris Fetch   07/16/2022, 4:53 PM

## 2022-07-16 NOTE — Telephone Encounter (Signed)
Surgical clearance request received by [facility] for this patient, signed by Dr Clelia Croft.  Faxed back along with last office note, fax confirmation received.

## 2022-07-17 NOTE — Telephone Encounter (Signed)
Left message for the pt to call back for IN OFFICE appt for pre op

## 2022-07-17 NOTE — Telephone Encounter (Signed)
Primary Cardiologist:None  Chart reviewed as part of pre-operative protocol coverage. Because of Gabriela Sanchez past medical history and time since last visit, he/she will require a follow-up visit in order to better assess preoperative cardiovascular risk.  Pre-op covering staff: - Please schedule appointment and call patient to inform them. - Please contact requesting surgeon's office via preferred method (i.e, phone, fax) to inform them of need for appointment prior to surgery.  No request to hold medications.   Levi Aland, NP-C     07/17/2022, 8:17 AM [address], [address] Office [phone] Fax [phone]

## 2022-07-18 NOTE — Telephone Encounter (Signed)
I started leaving a message x 2 for the pt in regard to needing an app in office for pre op clearance. Pt then answered the phone and tells me that she is not having the surgery now and that it was just in case if the therapy on her arm did not work. I stated that I will make note of this. I will update the pre op provider know of our conversation. I will send FYI to requesting office as well of our conversation this morning. Pt thanked me for the help. I will remove from the pre op call back pool.

## 2022-10-10 ENCOUNTER — Inpatient Hospital Stay: Payer: Medicare PPO | Attending: Oncology

## 2022-10-10 ENCOUNTER — Other Ambulatory Visit: Payer: Self-pay

## 2022-10-10 ENCOUNTER — Inpatient Hospital Stay: Payer: Medicare PPO | Admitting: Oncology

## 2022-10-10 VITALS — BP 177/57 | HR 70 | Temp 97.5°F | Resp 16 | Wt 196.9 lb

## 2022-10-10 DIAGNOSIS — D508 Other iron deficiency anemias: Secondary | ICD-10-CM

## 2022-10-10 DIAGNOSIS — Z79899 Other long term (current) drug therapy: Secondary | ICD-10-CM | POA: Insufficient documentation

## 2022-10-10 DIAGNOSIS — D509 Iron deficiency anemia, unspecified: Secondary | ICD-10-CM | POA: Diagnosis not present

## 2022-10-10 LAB — IRON AND IRON BINDING CAPACITY (CC-WL,HP ONLY)
Iron: 47 ug/dL (ref 28–170)
Saturation Ratios: 14 % (ref 10.4–31.8)
TIBC: 329 ug/dL (ref 250–450)
UIBC: 282 ug/dL (ref 148–442)

## 2022-10-10 LAB — CBC WITH DIFFERENTIAL (CANCER CENTER ONLY)
Abs Immature Granulocytes: 0.02 10*3/uL (ref 0.00–0.07)
Basophils Absolute: 0.1 10*3/uL (ref 0.0–0.1)
Basophils Relative: 1 %
Eosinophils Absolute: 0.3 10*3/uL (ref 0.0–0.5)
Eosinophils Relative: 4 %
HCT: 39.6 % (ref 36.0–46.0)
Hemoglobin: 13.4 g/dL (ref 12.0–15.0)
Immature Granulocytes: 0 %
Lymphocytes Relative: 20 %
Lymphs Abs: 1.4 10*3/uL (ref 0.7–4.0)
MCH: 30 pg (ref 26.0–34.0)
MCHC: 33.8 g/dL (ref 30.0–36.0)
MCV: 88.8 fL (ref 80.0–100.0)
Monocytes Absolute: 0.8 10*3/uL (ref 0.1–1.0)
Monocytes Relative: 11 %
Neutro Abs: 4.6 10*3/uL (ref 1.7–7.7)
Neutrophils Relative %: 64 %
Platelet Count: 259 10*3/uL (ref 150–400)
RBC: 4.46 MIL/uL (ref 3.87–5.11)
RDW: 13.2 % (ref 11.5–15.5)
WBC Count: 7.2 10*3/uL (ref 4.0–10.5)
nRBC: 0 % (ref 0.0–0.2)

## 2022-10-10 LAB — FERRITIN: Ferritin: 72 ng/mL (ref 11–307)

## 2022-10-10 NOTE — Progress Notes (Signed)
Hematology and Oncology Follow Up Visit  Gabriela Sanchez [id number] [birth year] 76 y.o. 10/10/2022 2:46 PM [name], [name], MDWile, [name], MD   Principle Diagnosis: 76 year old woman with iron deficiency anemia related to poor iron absorption diagnosed in December 2022.    Prior therapy: IV iron infusion with Venofer for a total of 1000 mg completed in August 2023 and previously in February 2023.   Current therapy: Active surveillance and repeat IV iron infusion.    Interim History: Gabriela Sanchez returns today for a follow-up.  Since last visit, she reports no major changes in her health.  She has tolerated the last infusion of iron without any complaints.  She reports major improvement in her activity level and performance status.  She denies any nausea, fatigue or hematochezia.  Hemoccult testing has been negative.     Medications: Updated on review. Current Outpatient Medications  Medication Sig Dispense Refill   amLODipine (NORVASC) 10 MG tablet Take 10 mg by mouth daily. (Patient not taking: Reported on 06/06/2022)     aspirin 81 MG chewable tablet Chew 81 mg by mouth daily. (Patient not taking: Reported on 06/06/2022)     CYANOCOBALAMIN PO Place 1 tablet under the tongue daily. (Patient not taking: Reported on 06/06/2022)     Glucosamine Sulfate 500 MG TABS Take 500 mg by mouth daily. (Patient not taking: Reported on 06/06/2022)     hydrochlorothiazide (MICROZIDE) 12.5 MG capsule Take 12.5 mg by mouth every morning. (Patient not taking: Reported on 06/06/2022)     ibuprofen (ADVIL,MOTRIN) 600 MG tablet Take 1 tablet (600 mg total) by mouth every 6 (six) hours as needed for pain. 30 tablet 0   iron polysaccharides (NIFEREX) 150 MG capsule Take 150 mg by mouth 2 (two) times daily.     Multiple Vitamin (MULTIVITAMIN WITH MINERALS) TABS Take 1 tablet by mouth daily. (Patient not taking: Reported on 06/06/2022)     omega-3 acid ethyl esters (LOVAZA) 1 G capsule Take 1 g by mouth daily.  (Patient not taking: Reported on 06/06/2022)     No current facility-administered medications for this visit.     Allergies:  Allergies  Allergen Reactions   Ampicillin     REACTION: rash   Erythromycin Ethylsuccinate     REACTION: rash   Lisinopril Cough   Losartan Other (See Comments)    Chest tightness,sob,dizziness   Other Nausea Only   Simvastatin     REACTION: ARM PAIN   Sulfa Antibiotics Other (See Comments)    Gi upset   Sulfamethoxazole     REACTION: rash   Penicillin G Rash     Physical Exam:   Blood pressure (!) 177/57, pulse 70, temperature (!) 97.5 F (36.4 C), temperature source Oral, resp. rate 16, weight 196 lb 14.4 oz (89.3 kg), SpO2 98 %.   ECOG: 1   General appearance: Alert, awake without any distress. Head: Atraumatic without abnormalities Oropharynx: Without any thrush or ulcers. Eyes: No scleral icterus. Lymph nodes: No lymphadenopathy noted in the cervical, supraclavicular, or axillary nodes Heart:regular rate and rhythm, without any murmurs or gallops.   Lung: Clear to auscultation without any rhonchi, wheezes or dullness to percussion. Abdomin: Soft, nontender without any shifting dullness or ascites. Musculoskeletal: No clubbing or cyanosis. Neurological: No motor or sensory deficits. Skin: No rashes or lesions.     Lab Results: Lab Results  Component Value Date   WBC 6.2 05/20/2022   HGB 10.3 (L) 05/20/2022   HCT 34.2 (L) 05/20/2022  MCV 72.3 (L) 05/20/2022   PLT 366 05/20/2022     Chemistry      Component Value Date/Time   NA 139 05/12/2022 1851   NA 140 05/23/2021 1104   K 4.2 05/12/2022 1851   CL 105 05/12/2022 1851   CO2 25 05/12/2022 1851   BUN 24 (H) 05/12/2022 1851   BUN 15 05/23/2021 1104   CREATININE 1.00 05/12/2022 1851      Component Value Date/Time   CALCIUM 8.9 05/12/2022 1851   ALKPHOS 76 02/09/2008 0922   AST 21 02/09/2008 0922   ALT 17 02/09/2008 0922   BILITOT 0.4 02/09/2008 0922          Impression and Plan:  76 year old with:  1.  Iron deficiency anemia diagnosed in December 2022.  This was related to poor iron absorption.  Related to poor iron absorption    Laboratory data in the last year reviewed and showed persistent iron deficiency despite IV iron replacement on 2 separate occasions.  Etiology is related to poor iron absorption although I recommended completing her GI workup.  CBC from today showed a hemoglobin of 13.4 with iron studies are currently pending.  Risks and benefits of repeat intravenous iron infusion were discussed and will be considered if she has further drop in her iron.  She is agreeable to this plan.     2.  Colon cancer and GI malignancy screening: CT scan obtained in January 2023 did not show any evidence of malignancy.  She continues to follow with her primary care physician for Hemoccult testing periodically.     3.  Follow-up: As needed in the future if she develops any decline in her iron.   30  minutes were dedicated to this encounter.  The time was spent on updating disease status, treatment choices and outlining future plan of care review.  Zola Button, MD 12/1/20232:46 PM

## 2023-04-20 ENCOUNTER — Other Ambulatory Visit: Payer: Self-pay | Admitting: Internal Medicine

## 2023-04-20 DIAGNOSIS — I7789 Other specified disorders of arteries and arterioles: Secondary | ICD-10-CM

## 2023-06-05 IMAGING — CT CT ANGIO CHEST
3 of 8 series · 18 of 46 positions shown · IV contrast (OMNIPAQUE 350)
Comparison: 05/21/2020

CLINICAL DATA: Aortic atherosclerosis and focal descending thoracic
aorta penetrating ulcer measuring 6 mm by coronary CTA

EXAM:
CT ANGIOGRAPHY CHEST WITH CONTRAST
TECHNIQUE: Multidetector CT imaging of the chest was performed using the
standard protocol during bolus administration of intravenous
contrast. Multiplanar CT image reconstructions and MIPs were
obtained to evaluate the vascular anatomy.
CONTRAST:  100mL OMNIPAQUE IOHEXOL 350 MG/ML SOLN

[Series 4: aorta 3.0 bf37 2 · axial · 0.73mm/px · z∈[-348,-80]mm · 13 of 105 slices shown]
[im 8/105  lung]
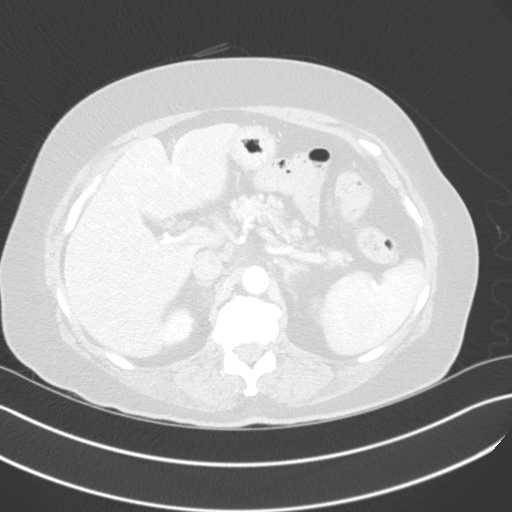
[im 15/105  soft-tissue]
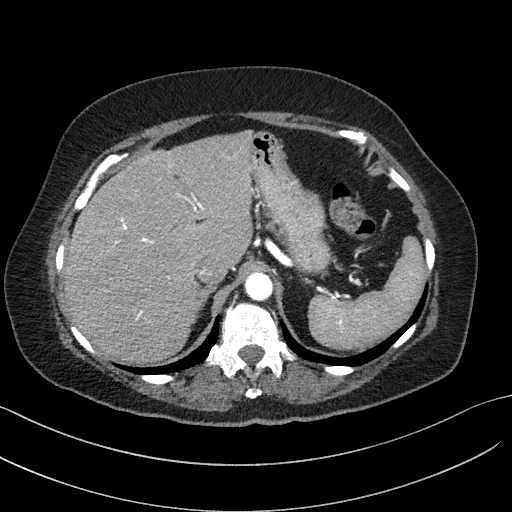
[im 23/105  lung]
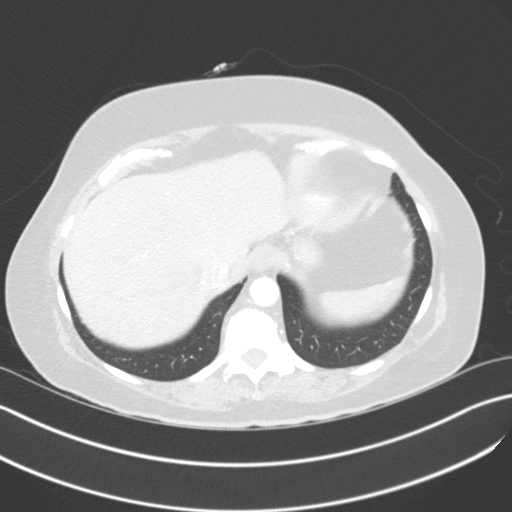
[im 30/105  soft-tissue]
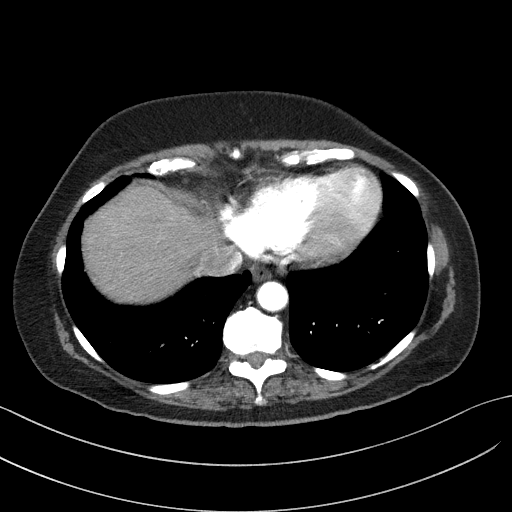
[im 38/105  lung]
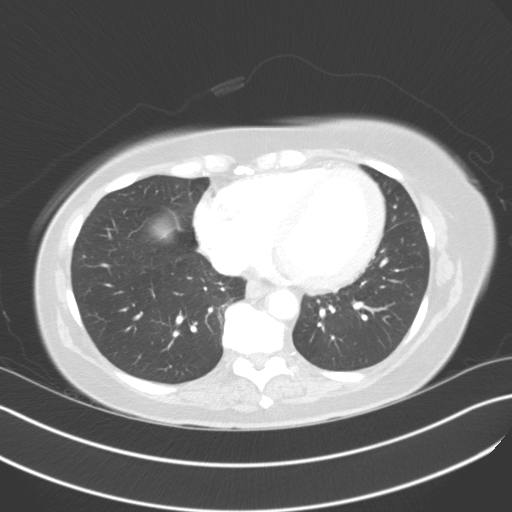
[im 45/105  soft-tissue]
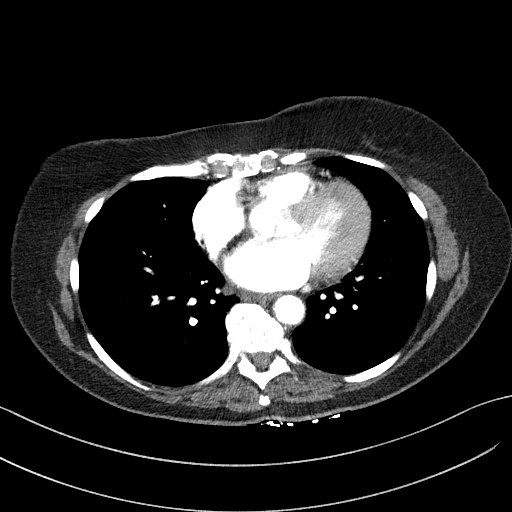
[im 53/105  lung]
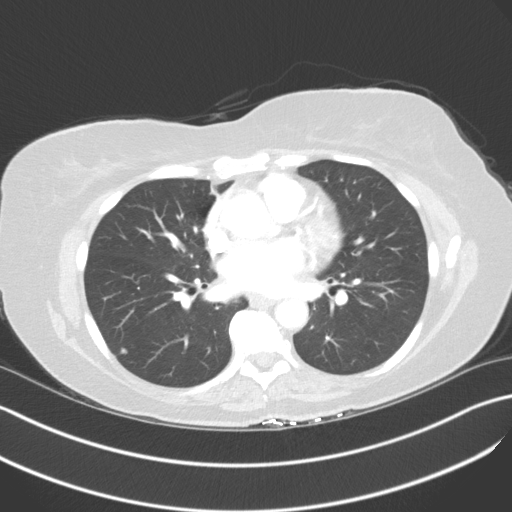
[im 60/105  soft-tissue]
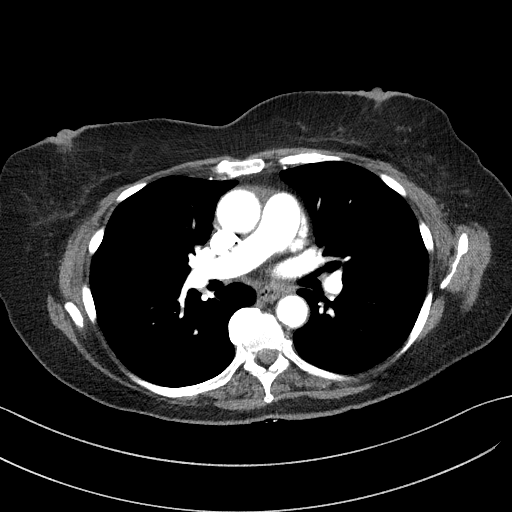
[im 67/105  lung]
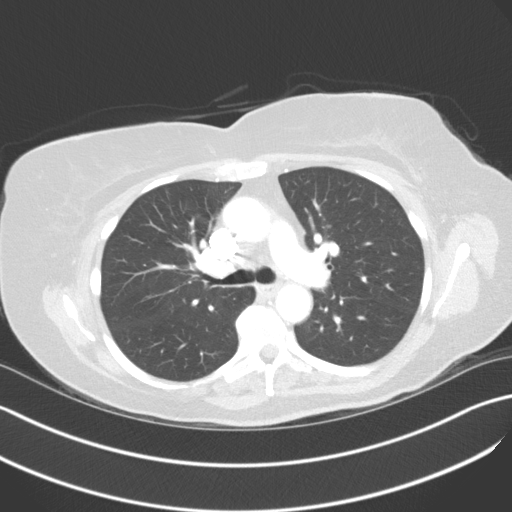
[im 75/105  soft-tissue]
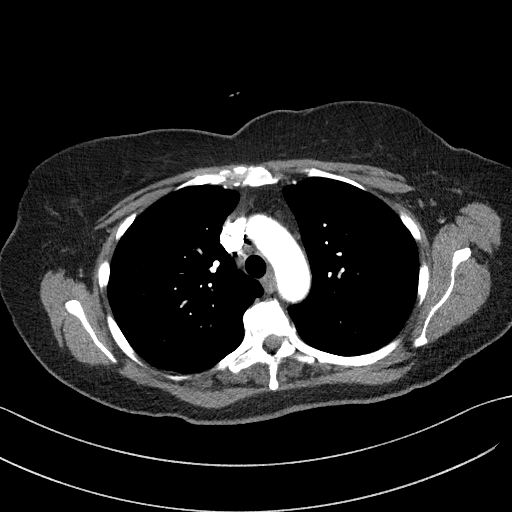
[im 82/105  lung]
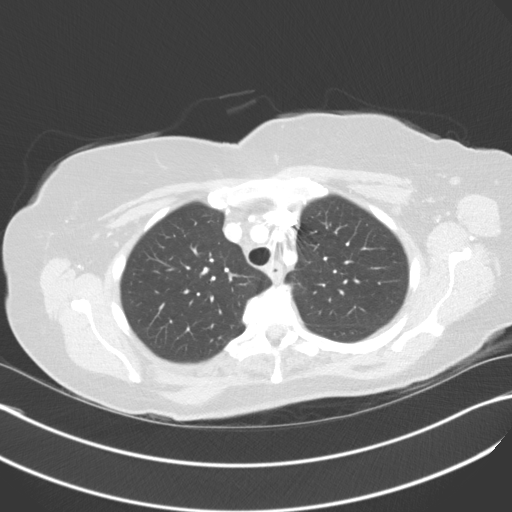
[im 90/105  soft-tissue]
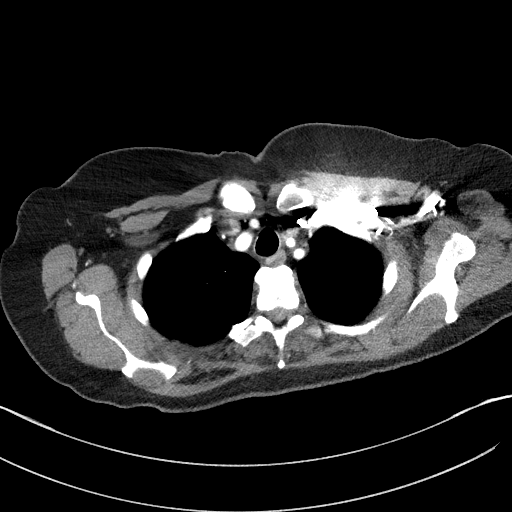
[im 97/105  lung]
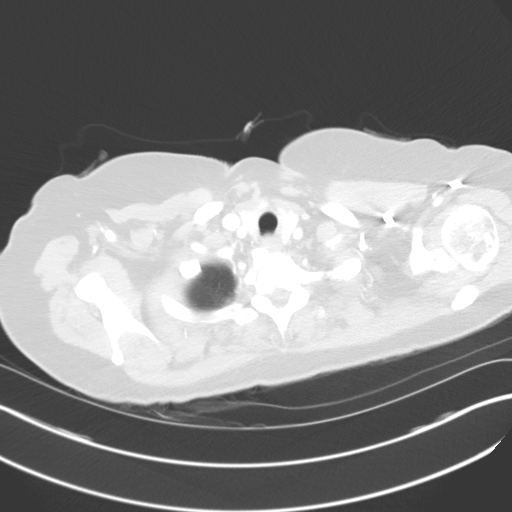

[Series 5: lung · axial · 0.73mm/px · z∈[-348,-302]mm · 2 of 105 slices shown]
[im 8/105  soft-tissue]
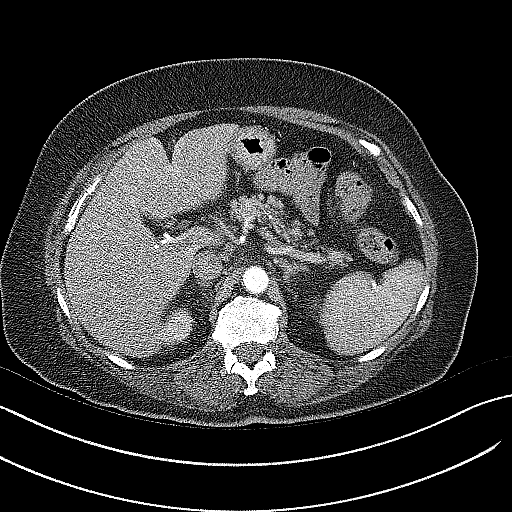
[im 23/105  soft-tissue]
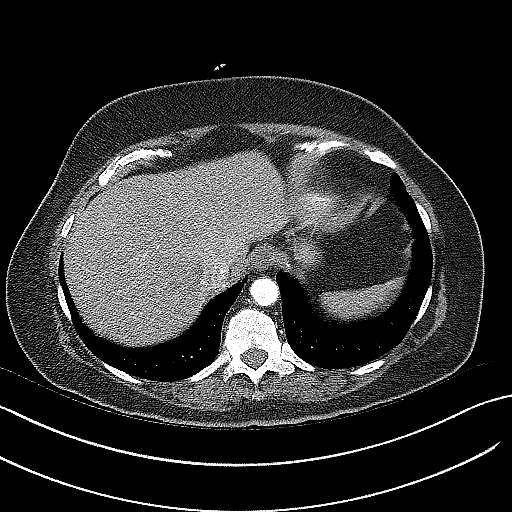

[Series 7: coronals · coronal · 0.62mm/px · 3 of 109 slices shown]
[im 28/109  soft-tissue]
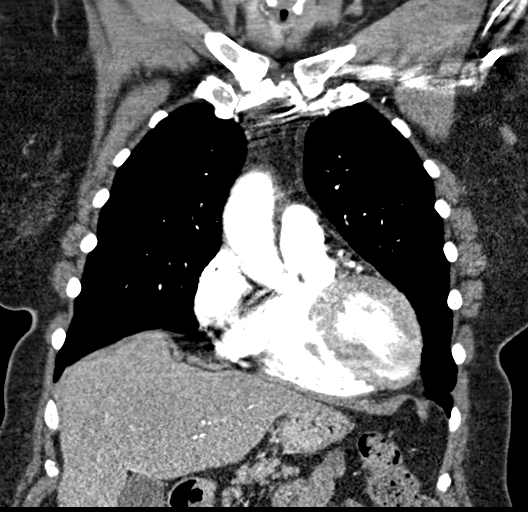
[im 55/109  soft-tissue]
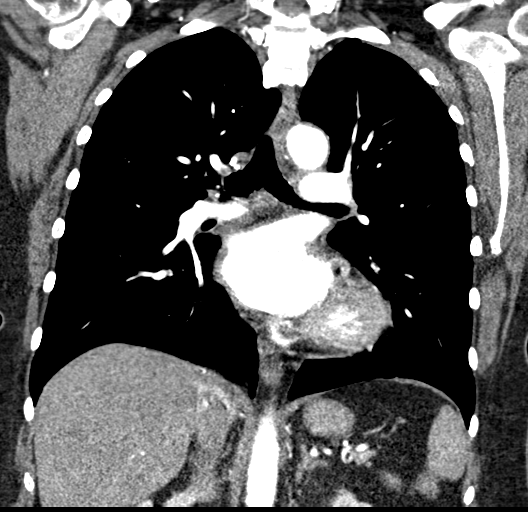
[im 82/109  soft-tissue]
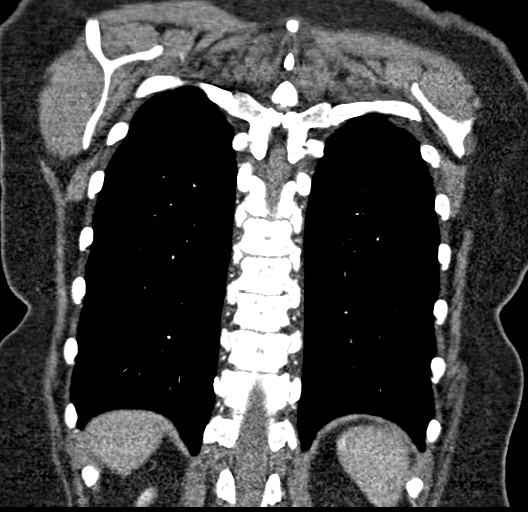

[18 of 46 positions shown; findings below may reference images not displayed]

FINDINGS: Cardiovascular: Thoracic aortic atherosclerosis, unchanged.
Ascending thoracic aortic diameter 3.2 cm. Negative for aneurysm. No
acute dissection. Patent 3 vessel arch anatomy. Similar descending
thoracic aorta wall irregularity and atherosclerotic change. Small
focal subcentimeter descending thoracic aorta penetrating ulcer
again noted, unchanged.

No mediastinal hemorrhage or hematoma.

Central pulmonary arteries are patent. No significant filling defect
or pulmonary embolus.

Normal heart size.  No pericardial effusion.

Mediastinum/Nodes: No enlarged mediastinal, hilar, or axillary lymph
nodes. Thyroid gland, trachea, and esophagus demonstrate no
significant findings.

Lungs/Pleura: No focal pneumonia, collapse or consolidation. No
interstitial process or edema. Trachea and central airways are
patent.

Posterolateral right lower lobe 4 mm nodule noted, image 53/5.

No pleural abnormality, effusion or pneumothorax.

Upper Abdomen: No acute abnormality.

Musculoskeletal: Degenerative changes noted of the spine. No acute
osseous finding.

Review of the MIP images confirms the above findings.
IMPRESSION: Negative for thoracic aortic aneurysm.

Stable 6 mm descending thoracic aorta penetrating atherosclerotic
ulcer.

No other acute intrathoracic finding

4 mm right lower lobe pulmonary nodule

No follow-up needed if patient is low-risk. Non-contrast chest CT
can be considered in 12 months if patient is high-risk. This
recommendation follows the consensus statement: Guidelines for
Management of Incidental Pulmonary Nodules Detected on CT Images:

Aortic Atherosclerosis ([id number]-WJV.V).

## 2023-11-20 IMAGING — CT CT CHEST-ABD-PELV W/ CM
2 of 5 series · 14 of 46 positions shown, 16 images · IV contrast (APPLIED)
Comparison: CT June 06, 2021

CLINICAL DATA: Unintentional weight loss.  Severe anemia.

EXAM:
CT CHEST, ABDOMEN, AND PELVIS WITH CONTRAST
TECHNIQUE: Multidetector CT imaging of the chest, abdomen and pelvis was
performed following the standard protocol during bolus
administration of intravenous contrast.

[Series 3: cap with · axial · 0.81mm/px · z∈[-580,-80]mm · 11 of 122 slices shown, 13 images]
[im 11/122  soft-tissue]
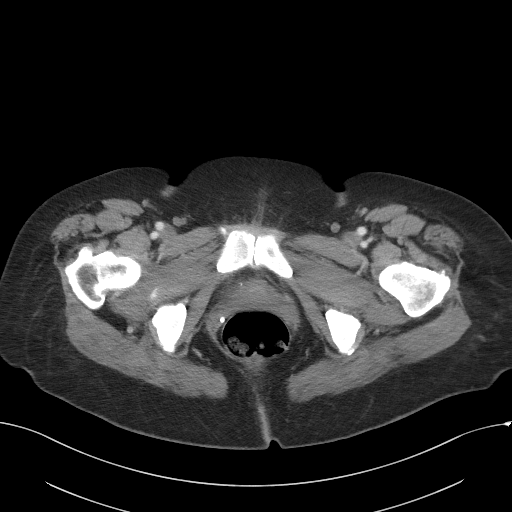
[im 11/122  bone]
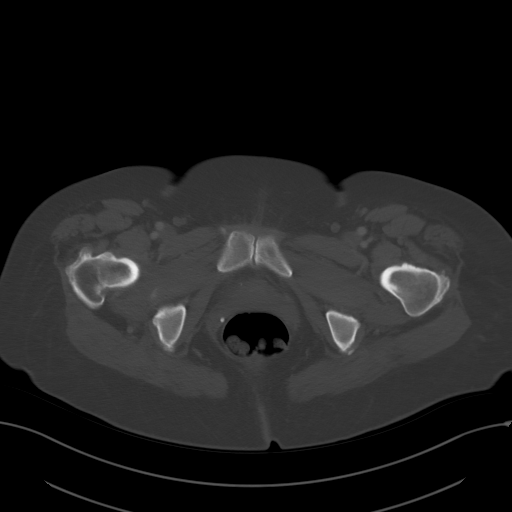
[im 21/122  soft-tissue]
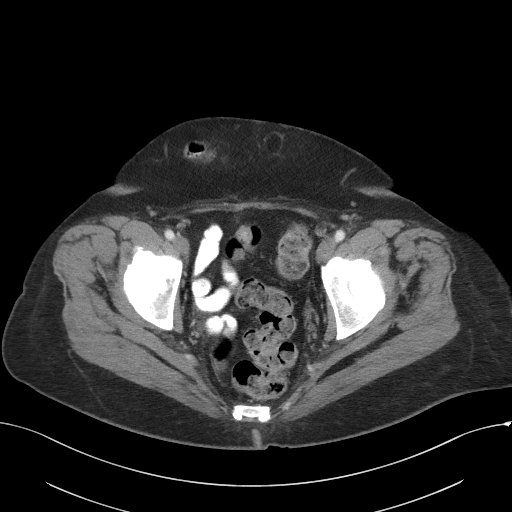
[im 31/122  soft-tissue]
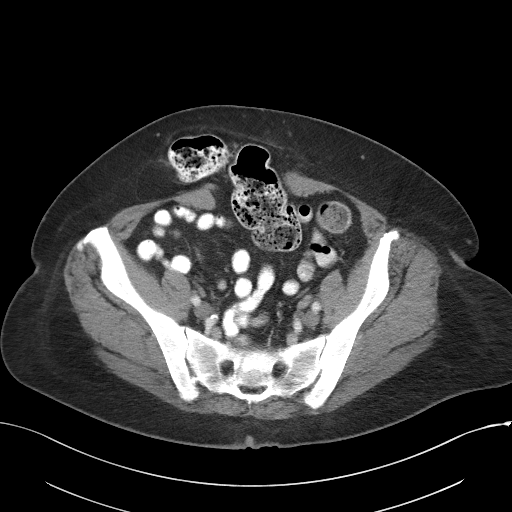
[im 41/122  soft-tissue]
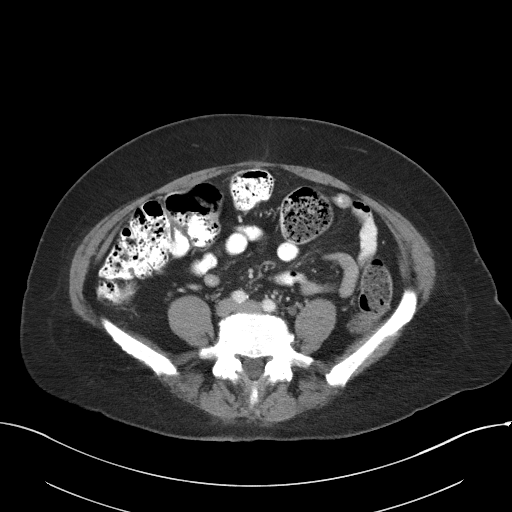
[im 51/122  soft-tissue]
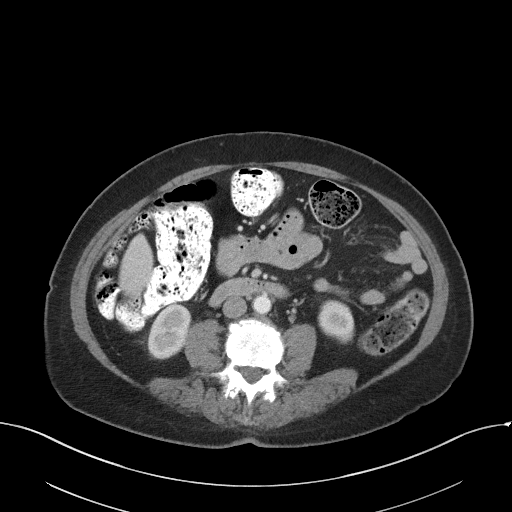
[im 61/122  soft-tissue]
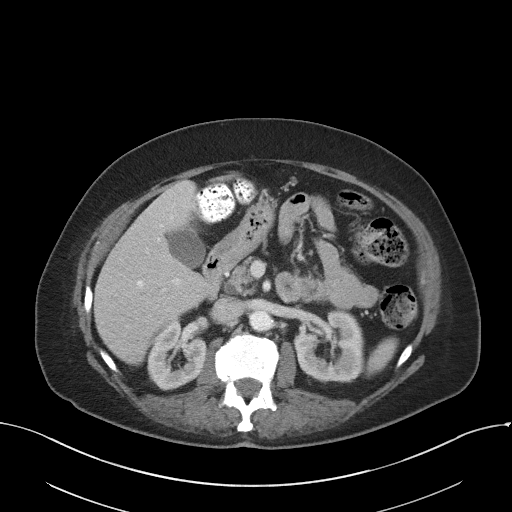
[im 71/122  soft-tissue]
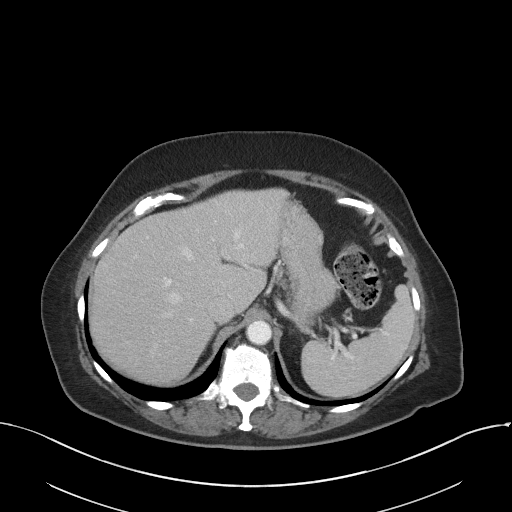
[im 81/122  soft-tissue]
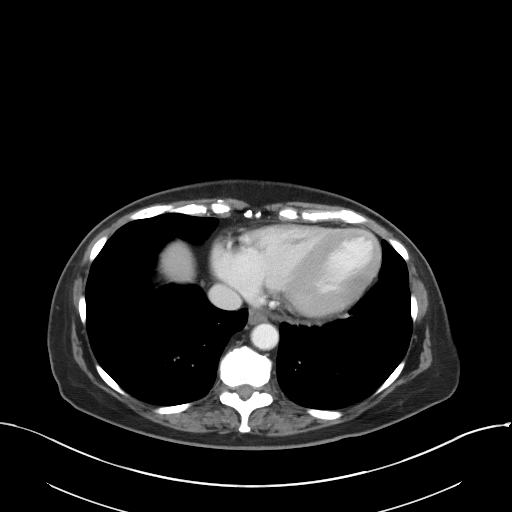
[im 91/122  soft-tissue]
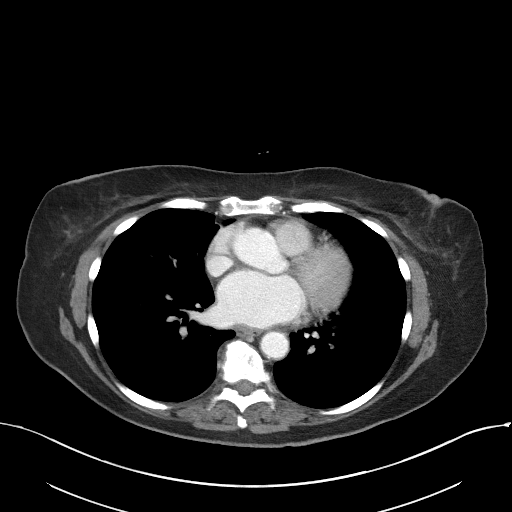
[im 91/122  bone]
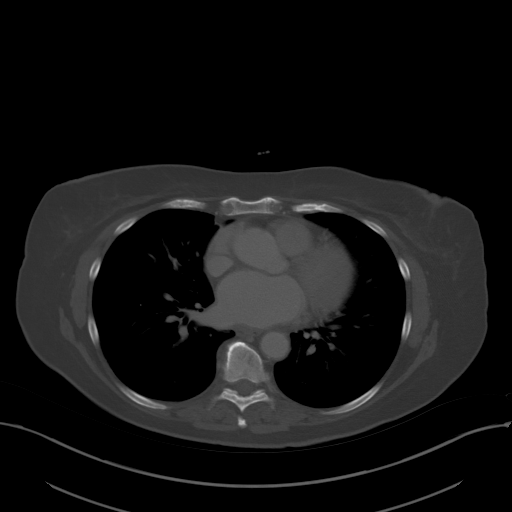
[im 101/122  soft-tissue]
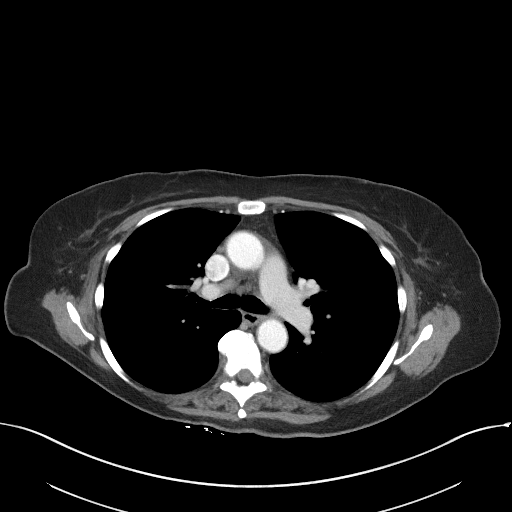
[im 111/122  soft-tissue]
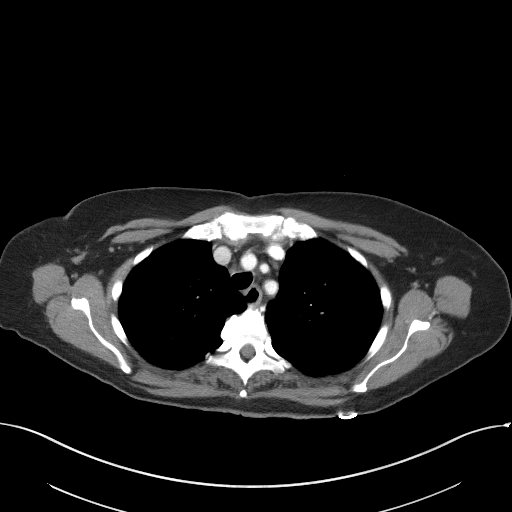

[Series 6: coronals · coronal · 0.95mm/px · 3 of 131 slices shown]
[im 44/131  soft-tissue]
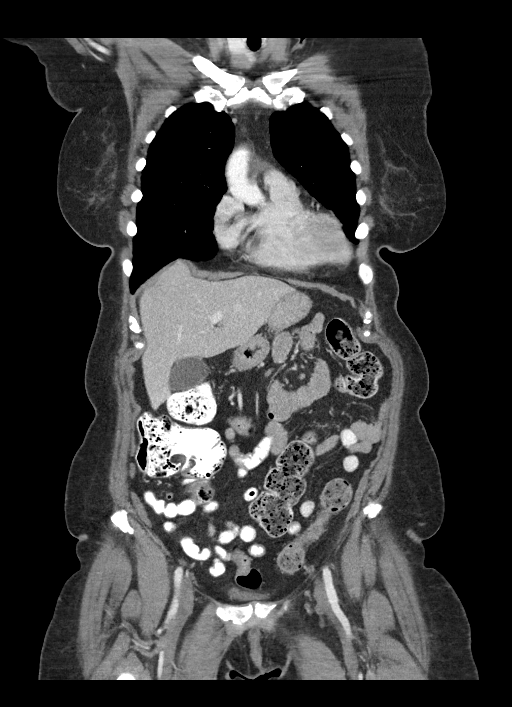
[im 58/131  soft-tissue]
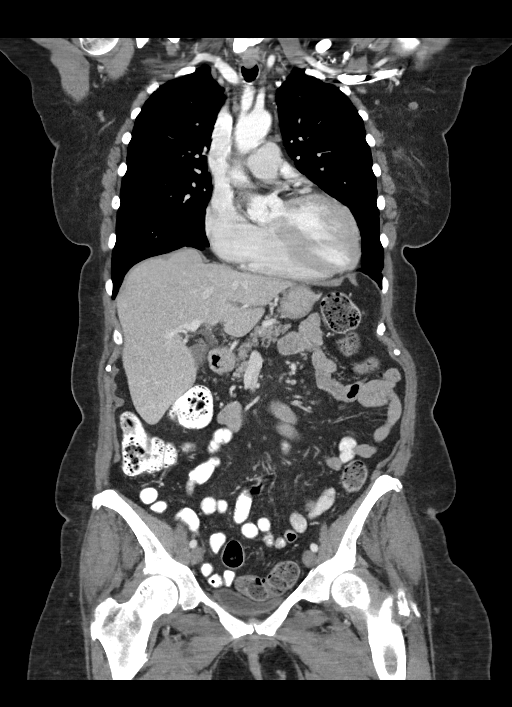
[im 73/131  soft-tissue]
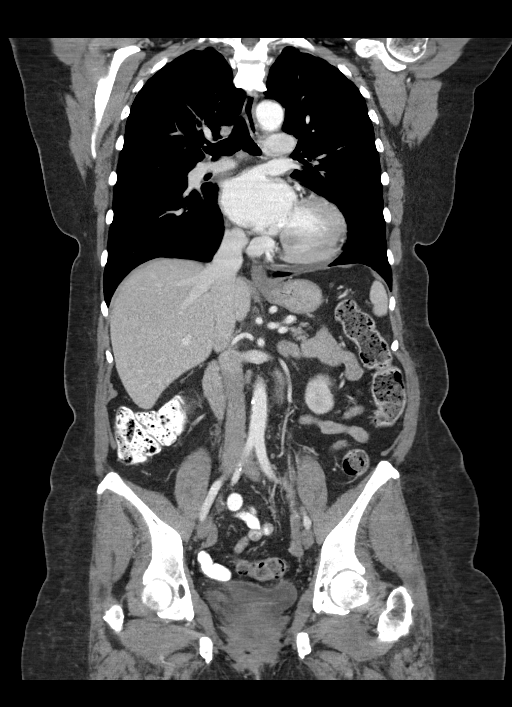

[14 of 46 positions shown; findings below may reference images not displayed]

RADIATION DOSE REDUCTION: This exam was performed according to the
departmental dose-optimization program which includes automated
exposure control, adjustment of the mA and/or kV according to
patient size and/or use of iterative reconstruction technique.

CONTRAST:  100mL OMNIPAQUE IOHEXOL 300 MG/ML  SOLN
FINDINGS: CT CHEST FINDINGS

Cardiovascular: Scattered thoracic aortic atherosclerosis without
aneurysmal dilation or dissection. No central pulmonary embolus on
this nondedicated study. Normal size heart. No significant
pericardial effusion/thickening.

Mediastinum/Nodes: Prominent mediastinal lymph nodes measuring up to
7 mm on image [DATE] are stable dating back to June 06, 2021 and not
pathologically enlarged by size criteria. No pathologically enlarged
mediastinal, hilar, or axillary lymph nodes. Thyroid gland, trachea,
and esophagus demonstrate no significant findings.

Lungs/Pleura: Tiny 2 mm pulmonary nodule in the right middle lobe on
image 68/5 and 4 mm right lower lobe pulmonary nodule on image 76/5
are stable. No new suspicious pulmonary nodules or masses. No
pleural effusion. No pneumothorax.

Musculoskeletal: Multilevel degenerative changes spine. Degenerative
changes bilateral glenohumeral joints. No aggressive lytic or
blastic lesion of bone.

CT ABDOMEN PELVIS FINDINGS

Hepatobiliary: No suspicious hepatic lesion. Gallbladder is
unremarkable. No biliary ductal dilation.

Pancreas: No pancreatic ductal dilation or evidence of acute
inflammation.

Spleen: Normal in size without focal abnormality.

Adrenals/Urinary Tract: Bilateral adrenal glands appear normal. No
hydronephrosis. Symmetric enhancement and excretion of contrast from
bilateral kidneys. No suspicious renal mass. Urinary bladder is
unremarkable for degree of distension.

Stomach/Bowel: Radiopaque enteric contrast material traverses the
splenic flexure. Stomach is decompressed limiting evaluation. No
pathologic dilation of small or large bowel. Appendix and terminal
ileum appear normal. Large volume of formed stool throughout the
colon. No suspicious colonic wall thickening or mass like lesions
identified.

Vascular/Lymphatic: Minimal scattered aortic atherosclerosis without
abdominal aortic aneurysm. No pathologically enlarged abdominal or
pelvic lymph nodes.

Reproductive: Status post hysterectomy. No adnexal masses.

Other: Large ventral hernia contains fat and nonobstructed portion
of colon with a 6.6 cm aperture width. No significant abdominopelvic
free fluid.

Musculoskeletal: Thoracic diffuse idiopathic skeletal hyperostosis.
Lumbar spondylosis. Degenerative changes bilateral hips. No acute
osseous abnormality.
IMPRESSION: 1. No acute abnormality within the chest, abdomen, or pelvis.
2. Large volume of formed stool throughout the colon suggesting
constipation.
3. Large ventral hernia contains fat and nonobstructed portion of
colon.
4. Stable small right-sided pulmonary nodules measuring up to 4 mm
No follow-up needed if patient is low-risk (and has no known or
suspected primary neoplasm). Non-contrast chest CT can be considered
in 12 months if patient is high-risk. This recommendation follows
the consensus statement: Guidelines for Management of Incidental
Pulmonary Nodules Detected on CT Images: From the [HOSPITAL]
nodules or masses.
5.  Aortic Atherosclerosis ([id number]-3N7.7).

## 2023-11-24 DIAGNOSIS — R739 Hyperglycemia, unspecified: Secondary | ICD-10-CM | POA: Diagnosis not present

## 2023-11-24 DIAGNOSIS — Z9181 History of falling: Secondary | ICD-10-CM | POA: Diagnosis not present

## 2023-11-24 DIAGNOSIS — I517 Cardiomegaly: Secondary | ICD-10-CM | POA: Diagnosis not present

## 2023-11-24 DIAGNOSIS — Z0000 Encounter for general adult medical examination without abnormal findings: Secondary | ICD-10-CM | POA: Diagnosis not present

## 2023-11-24 DIAGNOSIS — E782 Mixed hyperlipidemia: Secondary | ICD-10-CM | POA: Diagnosis not present

## 2023-11-24 DIAGNOSIS — Z780 Asymptomatic menopausal state: Secondary | ICD-10-CM | POA: Diagnosis not present

## 2023-11-24 DIAGNOSIS — Z90710 Acquired absence of both cervix and uterus: Secondary | ICD-10-CM | POA: Diagnosis not present

## 2023-11-24 DIAGNOSIS — I10 Essential (primary) hypertension: Secondary | ICD-10-CM | POA: Diagnosis not present

## 2023-11-27 DIAGNOSIS — D508 Other iron deficiency anemias: Secondary | ICD-10-CM | POA: Diagnosis not present

## 2023-11-27 DIAGNOSIS — E782 Mixed hyperlipidemia: Secondary | ICD-10-CM | POA: Diagnosis not present

## 2023-11-27 DIAGNOSIS — Z0000 Encounter for general adult medical examination without abnormal findings: Secondary | ICD-10-CM | POA: Diagnosis not present

## 2023-11-27 DIAGNOSIS — R739 Hyperglycemia, unspecified: Secondary | ICD-10-CM | POA: Diagnosis not present

## 2023-12-03 DIAGNOSIS — I10 Essential (primary) hypertension: Secondary | ICD-10-CM | POA: Diagnosis not present

## 2023-12-03 DIAGNOSIS — R7303 Prediabetes: Secondary | ICD-10-CM | POA: Diagnosis not present

## 2023-12-03 DIAGNOSIS — E782 Mixed hyperlipidemia: Secondary | ICD-10-CM | POA: Diagnosis not present

## 2023-12-29 DIAGNOSIS — I10 Essential (primary) hypertension: Secondary | ICD-10-CM | POA: Diagnosis not present

## 2023-12-29 DIAGNOSIS — R011 Cardiac murmur, unspecified: Secondary | ICD-10-CM | POA: Diagnosis not present

## 2023-12-29 DIAGNOSIS — R7303 Prediabetes: Secondary | ICD-10-CM | POA: Diagnosis not present

## 2024-01-14 DIAGNOSIS — I10 Essential (primary) hypertension: Secondary | ICD-10-CM | POA: Diagnosis not present

## 2024-01-14 DIAGNOSIS — R0989 Other specified symptoms and signs involving the circulatory and respiratory systems: Secondary | ICD-10-CM | POA: Diagnosis not present

## 2024-01-14 DIAGNOSIS — R9431 Abnormal electrocardiogram [ECG] [EKG]: Secondary | ICD-10-CM | POA: Diagnosis not present

## 2024-01-14 DIAGNOSIS — E782 Mixed hyperlipidemia: Secondary | ICD-10-CM | POA: Diagnosis not present

## 2024-01-14 DIAGNOSIS — R011 Cardiac murmur, unspecified: Secondary | ICD-10-CM | POA: Diagnosis not present

## 2024-01-27 DIAGNOSIS — E782 Mixed hyperlipidemia: Secondary | ICD-10-CM | POA: Diagnosis not present

## 2024-01-27 DIAGNOSIS — R0989 Other specified symptoms and signs involving the circulatory and respiratory systems: Secondary | ICD-10-CM | POA: Diagnosis not present

## 2024-01-27 DIAGNOSIS — I10 Essential (primary) hypertension: Secondary | ICD-10-CM | POA: Diagnosis not present

## 2024-01-27 DIAGNOSIS — R9431 Abnormal electrocardiogram [ECG] [EKG]: Secondary | ICD-10-CM | POA: Diagnosis not present

## 2024-01-27 DIAGNOSIS — R011 Cardiac murmur, unspecified: Secondary | ICD-10-CM | POA: Diagnosis not present

## 2024-02-24 DIAGNOSIS — I10 Essential (primary) hypertension: Secondary | ICD-10-CM | POA: Diagnosis not present

## 2024-02-24 DIAGNOSIS — E782 Mixed hyperlipidemia: Secondary | ICD-10-CM | POA: Diagnosis not present

## 2024-02-24 DIAGNOSIS — R9431 Abnormal electrocardiogram [ECG] [EKG]: Secondary | ICD-10-CM | POA: Diagnosis not present

## 2024-02-24 DIAGNOSIS — R011 Cardiac murmur, unspecified: Secondary | ICD-10-CM | POA: Diagnosis not present

## 2024-03-04 DIAGNOSIS — R0989 Other specified symptoms and signs involving the circulatory and respiratory systems: Secondary | ICD-10-CM | POA: Diagnosis not present

## 2024-03-04 DIAGNOSIS — I517 Cardiomegaly: Secondary | ICD-10-CM | POA: Diagnosis not present

## 2024-03-04 DIAGNOSIS — R011 Cardiac murmur, unspecified: Secondary | ICD-10-CM | POA: Diagnosis not present

## 2024-03-04 DIAGNOSIS — E7849 Other hyperlipidemia: Secondary | ICD-10-CM | POA: Diagnosis not present

## 2024-03-04 DIAGNOSIS — R9431 Abnormal electrocardiogram [ECG] [EKG]: Secondary | ICD-10-CM | POA: Diagnosis not present

## 2024-03-04 DIAGNOSIS — R0609 Other forms of dyspnea: Secondary | ICD-10-CM | POA: Diagnosis not present

## 2024-03-04 DIAGNOSIS — R002 Palpitations: Secondary | ICD-10-CM | POA: Diagnosis not present

## 2024-03-04 DIAGNOSIS — I10 Essential (primary) hypertension: Secondary | ICD-10-CM | POA: Diagnosis not present

## 2024-03-11 DIAGNOSIS — I10 Essential (primary) hypertension: Secondary | ICD-10-CM | POA: Diagnosis not present

## 2024-03-11 DIAGNOSIS — R9431 Abnormal electrocardiogram [ECG] [EKG]: Secondary | ICD-10-CM | POA: Diagnosis not present

## 2024-03-11 DIAGNOSIS — I517 Cardiomegaly: Secondary | ICD-10-CM | POA: Diagnosis not present

## 2024-03-18 DIAGNOSIS — S76012A Strain of muscle, fascia and tendon of left hip, initial encounter: Secondary | ICD-10-CM | POA: Diagnosis not present

## 2024-03-18 DIAGNOSIS — I10 Essential (primary) hypertension: Secondary | ICD-10-CM | POA: Diagnosis not present

## 2024-03-18 DIAGNOSIS — M25552 Pain in left hip: Secondary | ICD-10-CM | POA: Diagnosis not present

## 2024-03-23 DIAGNOSIS — I517 Cardiomegaly: Secondary | ICD-10-CM | POA: Diagnosis not present

## 2024-03-23 DIAGNOSIS — I10 Essential (primary) hypertension: Secondary | ICD-10-CM | POA: Diagnosis not present

## 2024-03-23 DIAGNOSIS — R9431 Abnormal electrocardiogram [ECG] [EKG]: Secondary | ICD-10-CM | POA: Diagnosis not present

## 2024-03-29 DIAGNOSIS — E782 Mixed hyperlipidemia: Secondary | ICD-10-CM | POA: Diagnosis not present

## 2024-03-29 DIAGNOSIS — R739 Hyperglycemia, unspecified: Secondary | ICD-10-CM | POA: Diagnosis not present

## 2024-03-29 DIAGNOSIS — R7309 Other abnormal glucose: Secondary | ICD-10-CM | POA: Diagnosis not present

## 2024-03-29 DIAGNOSIS — D508 Other iron deficiency anemias: Secondary | ICD-10-CM | POA: Diagnosis not present

## 2024-03-29 DIAGNOSIS — I10 Essential (primary) hypertension: Secondary | ICD-10-CM | POA: Diagnosis not present

## 2024-03-30 ENCOUNTER — Emergency Department (HOSPITAL_COMMUNITY)
Admission: EM | Admit: 2024-03-30 | Discharge: 2024-03-30 | Disposition: A | Discharge status: Home / Self Care | Attending: Emergency Medicine | Admitting: Emergency Medicine

## 2024-03-30 ENCOUNTER — Encounter (HOSPITAL_COMMUNITY): Payer: Self-pay

## 2024-03-30 DIAGNOSIS — I10 Essential (primary) hypertension: Secondary | ICD-10-CM | POA: Insufficient documentation

## 2024-03-30 DIAGNOSIS — D649 Anemia, unspecified: Secondary | ICD-10-CM | POA: Diagnosis not present

## 2024-03-30 LAB — BASIC METABOLIC PANEL WITH GFR
Anion gap: 7 (ref 5–15)
BUN: 12 mg/dL (ref 8–23)
CO2: 23 mmol/L (ref 22–32)
Calcium: 8.7 mg/dL — ABNORMAL LOW (ref 8.9–10.3)
Chloride: 102 mmol/L (ref 98–111)
Creatinine, Ser: 1 mg/dL (ref 0.44–1.00)
GFR, Estimated: 58 mL/min — ABNORMAL LOW (ref >60)
Glucose, Bld: 122 mg/dL — ABNORMAL HIGH (ref 70–99)
Potassium: 4 mmol/L (ref 3.5–5.1)
Sodium: 132 mmol/L — ABNORMAL LOW (ref 135–145)

## 2024-03-30 LAB — CBC WITH DIFFERENTIAL/PLATELET
Abs Immature Granulocytes: 0.03 10*3/uL (ref 0.00–0.07)
Basophils Absolute: 0.1 10*3/uL (ref 0.0–0.1)
Basophils Relative: 1 %
Eosinophils Absolute: 0.2 10*3/uL (ref 0.0–0.5)
Eosinophils Relative: 4 %
HCT: 22.2 % — ABNORMAL LOW (ref 36.0–46.0)
Hemoglobin: 6.5 g/dL — CL (ref 12.0–15.0)
Immature Granulocytes: 1 %
Lymphocytes Relative: 16 %
Lymphs Abs: 0.9 10*3/uL (ref 0.7–4.0)
MCH: 21.7 pg — ABNORMAL LOW (ref 26.0–34.0)
MCHC: 29.3 g/dL — ABNORMAL LOW (ref 30.0–36.0)
MCV: 74.2 fL — ABNORMAL LOW (ref 80.0–100.0)
Monocytes Absolute: 0.6 10*3/uL (ref 0.1–1.0)
Monocytes Relative: 11 %
Neutro Abs: 3.8 10*3/uL (ref 1.7–7.7)
Neutrophils Relative %: 67 %
Platelets: 422 10*3/uL — ABNORMAL HIGH (ref 150–400)
RBC: 2.99 MIL/uL — ABNORMAL LOW (ref 3.87–5.11)
RDW: 18.6 % — ABNORMAL HIGH (ref 11.5–15.5)
WBC: 5.6 10*3/uL (ref 4.0–10.5)
nRBC: 0 % (ref 0.0–0.2)

## 2024-03-30 LAB — PREPARE RBC (CROSSMATCH)

## 2024-03-30 MED ORDER — SODIUM CHLORIDE 0.9% IV SOLUTION: Freq: Once | INTRAVENOUS | Status: DC

## 2024-03-30 MED ORDER — IRBESARTAN 150 MG PO TABS
150.0000 mg | ORAL_TABLET | Freq: Every day | ORAL | Status: DC
  Administered 2024-03-30: 150 mg via ORAL
  Filled 2024-03-30: qty 1

## 2024-03-30 MED ORDER — FERROUS SULFATE 325 (65 FE) MG PO TABS: 325.0000 mg | ORAL_TABLET | Freq: Every day | ORAL | 1 refills | Status: DC

## 2024-03-30 MED ORDER — HYDROCHLOROTHIAZIDE 12.5 MG PO TABS
12.5000 mg | ORAL_TABLET | Freq: Once | ORAL | Status: AC
  Administered 2024-03-30: 12.5 mg via ORAL
  Filled 2024-03-30: qty 1

## 2024-03-30 MED ORDER — CLONIDINE HCL 0.1 MG PO TABS
0.1000 mg | ORAL_TABLET | Freq: Once | ORAL | Status: AC
Start: 2024-03-30 — End: 2024-03-30
  Administered 2024-03-30: 0.1 mg via ORAL
  Filled 2024-03-30: qty 1

## 2024-03-30 NOTE — ED Notes (Signed)
 Lab contacted due to wait time for blood transfusion. Lab tech told writer her blood was ready. No call was made to ER so writer knew it was ready.

## 2024-03-30 NOTE — ED Provider Notes (Signed)
 [place] EMERGENCY DEPARTMENT AT [facility] Provider Note   CSN: [id number] Arrival date & time: 03/30/24  0820     History  Chief Complaint  Patient presents with   Abnormal Lab    Gabriela Sanchez is a 78 y.o. female presenting from home with concern for low hemoglobin.  Patient reports she had a physical performed last week at her primary care doctor's office and had blood work done.  She was contacted a telling her that she was anemic and needed to come to the ER.  She cannot recall the exact number but thought it was hemoglobin of "6.9."    She denies any acute change in her day-to-day functioning but does report she has had a chronic fatigue with exertion for a long time.  She said she had attributed this to "getting old".  But she will often feel that she needs to stop the rest even walking through the grocery store.  She denies any specific lightheadedness or shortness of breath and is currently otherwise asymptomatic while seated in the ED.  She denies any dark or bloody stools.  In the past she was seen by hematology for suspected iron  deficiency anemia, and had been on iron  tablets and received iron  transfusions as well as blood transfusions.  She said that her numbers have stabilized and she has not seen the hematologist since 2023.  She is no longer on iron .  Per review of external records, hematology evaluation from December 2023, the patient has noted to have iron  deficiency anemia that was felt to be secondary to poor iron  absorption in the gut.  Her last hemoglobin check on her record was 13.4 in 10/10/22.  Prior to that she had been as low as 5.2 in 11/07/21, and 6.8 in 05/12/22.  HPI     Home Medications Prior to Admission medications   Medication Sig Start Date End Date Taking? Authorizing Provider  ferrous sulfate 325 (65 FE) MG tablet Take 1 tablet (325 mg total) by mouth daily for 30 doses. 03/30/24 04/29/24 Yes Danasha Melman, Janalyn Me, MD  amLODipine  (NORVASC) 10 MG tablet Take 10 mg by mouth daily. Patient not taking: Reported on 06/06/2022 05/10/22   [provider]  aspirin 81 MG chewable tablet Chew 81 mg by mouth daily. Patient not taking: Reported on 06/06/2022    [provider]  CYANOCOBALAMIN PO Place 1 tablet under the tongue daily. Patient not taking: Reported on 06/06/2022    [provider]  Glucosamine Sulfate 500 MG TABS Take 500 mg by mouth daily. Patient not taking: Reported on 06/06/2022    [provider]  hydrochlorothiazide (MICROZIDE) 12.5 MG capsule Take 12.5 mg by mouth every morning. Patient not taking: Reported on 06/06/2022 05/09/22   [provider]  ibuprofen  (ADVIL ,MOTRIN ) 600 MG tablet Take 1 tablet (600 mg total) by mouth every 6 (six) hours as needed for pain. 02/07/13   Lind Repine, MD  iron  polysaccharides (NIFEREX) 150 MG capsule Take 150 mg by mouth 2 (two) times daily. 05/02/22   [provider]  Multiple Vitamin (MULTIVITAMIN WITH MINERALS) TABS Take 1 tablet by mouth daily. Patient not taking: Reported on 06/06/2022    [provider]  omega-3 acid ethyl esters (LOVAZA) 1 G capsule Take 1 g by mouth daily. Patient not taking: Reported on 06/06/2022    [provider]      Allergies    Ampicillin, Erythromycin ethylsuccinate, Lisinopril, Losartan, Other, Simvastatin, Sulfa antibiotics, Sulfamethoxazole, and  Penicillin g    Review of Systems   Review of Systems  Physical Exam Updated Vital Signs BP (!) 201/67   Pulse (!) 59   Temp 98.4 F (36.9 C) (Oral)   Resp 16   Ht 5\' 4"  (1.626 m)   Wt 88.9 kg   SpO2 100%   BMI 33.64 kg/m  Physical Exam Constitutional:      General: She is not in acute distress. HENT:     Head: Normocephalic and atraumatic.  Eyes:     Pupils: Pupils are equal, round, and reactive to light.     Comments: Conjunctival pallor  Cardiovascular:     Rate and Rhythm: Normal rate and regular rhythm.   Pulmonary:     Effort: Pulmonary effort is normal. No respiratory distress.  Abdominal:     General: There is no distension.     Tenderness: There is no abdominal tenderness.  Skin:    General: Skin is warm and dry.  Neurological:     General: No focal deficit present.     Mental Status: She is alert. Mental status is at baseline.  Psychiatric:        Mood and Affect: Mood normal.        Behavior: Behavior normal.     ED Results / Procedures / Treatments   Labs (all labs ordered are listed, but only abnormal results are displayed) Labs Reviewed  CBC WITH DIFFERENTIAL/PLATELET - Abnormal; Notable for the following components:      Result Value   RBC 2.99 (*)    Hemoglobin 6.5 (*)    HCT 22.2 (*)    MCV 74.2 (*)    MCH 21.7 (*)    MCHC 29.3 (*)    RDW 18.6 (*)    Platelets 422 (*)    All other components within normal limits  BASIC METABOLIC PANEL WITH GFR - Abnormal; Notable for the following components:   Sodium 132 (*)    Glucose, Bld 122 (*)    Calcium  8.7 (*)    GFR, Estimated 58 (*)    All other components within normal limits  TYPE AND SCREEN  PREPARE RBC (CROSSMATCH)    EKG None  Radiology No results found.  Procedures Procedures    Medications Ordered in ED Medications  irbesartan (AVAPRO) tablet 150 mg (150 mg Oral Given by Other 03/30/24 1047)  0.9 %  sodium chloride  infusion (Manually program via Guardrails IV Fluids) (has no administration in time range)  hydrochlorothiazide (HYDRODIURIL) tablet 12.5 mg (12.5 mg Oral Given by Other 03/30/24 1047)  cloNIDine (CATAPRES) tablet 0.1 mg (0.1 mg Oral Given by Other 03/30/24 1047)    ED Course/ Medical Decision Making/ A&P Clinical Course as of 03/30/24 1600  Wed Mar 30, 2024  1007 RN notified me of elevated BP - will need to verify BP and find out what medicinations patient takes [MT]  1026 Paramedic provider verified pt takes valsartan 160 mg BID, hydrochlorothiazide 12.5, which have been ordered.   Manual BP confirmed.  Asymptomatic from HTN perspective [MT]  1059 Pt consented for blood, 2 units ordered.  She is adamant she does not want to stay in the hospital.  As an alternative we can restart her on iron  and refer her back into her hematologist office to consider restarting iron  infusions.  She will need to have her blood pressure and hemoglobin rechecked this week by her PCP and understands return precautions for worsening symptoms of anemia.  She is aware now  that her blood pressure is high but says that it was not as high when she was in the PCPs office last week, or there were no changes otherwise to her medications.  She has a blood pressure machine at home and will monitor it and keep an eye on her daily blood pressures.  Current BP now is 201/70 mmhg in the room [MT]  1505 Paramedic provider reports blood ready and being called up now.  We will plan for discharge after completion of transfusion. BP remains elevated, pt aware, she's understandably impatient and agitated for the long stay in the ED.   [MT]    Clinical Course User Index [MT] Maddax Palinkas, Janalyn Me, MD                                 Medical Decision Making Amount and/or Complexity of Data Reviewed Labs: ordered.  Risk OTC drugs. Prescription drug management.   This patient presents to the ED with concern for anemia. This involves an extensive number of treatment options, and is a complaint that carries with it a high risk of complications and morbidity.  The differential diagnosis includes iron  deficiency anemia  Co-morbidities that complicate the patient evaluation: History of iron  deficiency anemia   External records from outside source obtained and reviewed including oncology office records  I ordered and personally interpreted labs.  The pertinent results include: Hemoglobin 6.5, microcytic  I ordered medication including blood transfusion for anemia; blood pressure medications ordered including patient's home  blood pressure medicines that she did not take, for hypertension  I have reviewed the patients home medicines and have made adjustments as needed  Test Considered: No indication for CT imaging at this time   After the interventions noted above, I reevaluated the patient and found that they have: stayed the same    Disposition:  After consideration of the diagnostic results and the patients response to treatment, I feel that the patient would benefit from close outpatient follow-up.         Final Clinical Impression(s) / ED Diagnoses Final diagnoses:  Anemia, unspecified type  Hypertension, unspecified type    Rx / DC Orders ED Discharge Orders          Ordered    Ambulatory referral to Hematology / Oncology       Comments: Your emergency department provider has referred you to see a hematology/oncology specialist. These are physicians who specialize in blood disorders and cancers, or findings concerning for cancer. You will receive a phone call from the [facility] Office to set up your appointment within 2 business days: [employer] operate Mon - Fri, 8:00 a.m. to 5:00 p.m.; closed for federally recognized holidays. Please be sure your phone is not set to block numbers during this time.   03/30/24 1435    ferrous sulfate 325 (65 FE) MG tablet  Daily        03/30/24 1436              Arvilla Birmingham, MD 03/30/24 1600

## 2024-03-30 NOTE — Discharge Instructions (Addendum)
 Your hemoglobin was 6.5 today, which is quite low.  This means you have anemia.  This may be related to the same issue has had in the past with iron  deficiency anemia.  We gave you blood transfusion in the ER today, and I restarted you on iron  tablets.  You should follow up with the hematology clinic, and I placed the referral back into the clinic to help expedite your appointment.  You should hear from their scheduling office within the next 3 business days.  You may need regular iron  infusion scheduled again as an outpatient.  It is very important that your primary care clinic rechecks her hemoglobin this week.  If you have new or worsening shortness of breath, lightheadedness, chest pain, loss of consciousness, or persistent dizziness, please return to the ER.  These may be signs of more serious or worsening anemia.  Please also note that your blood pressure was quite high today.  It is very important that you take your regular blood pressure medications at home, and that you keep a daily diary of your blood pressure.  Follow-up with your doctors office for this issue as well.

## 2024-03-30 NOTE — ED Triage Notes (Signed)
 Pt presents d/t low hemoglobin.  Pt reports she was called by her PCP and told to go to the ED.  Hx of anemia.  Pt reports she is not on any iron  supplements.   Pt denies any bleeding or other complaints.

## 2024-03-30 NOTE — ED Notes (Signed)
 Md made aware of BP. Awaiting orders. Patient asymptomatic. Denies headache, pain or any other symptoms

## 2024-03-31 LAB — TYPE AND SCREEN
ABO/RH(D): O POS
Antibody Screen: NEGATIVE
Unit division: 0
Unit division: 0

## 2024-03-31 LAB — BPAM RBC
Blood Product Expiration Date: 202505222359
Blood Product Expiration Date: 202506192359
ISSUE DATE / TIME: 202505211528
ISSUE DATE / TIME: 202505211812
Unit Type and Rh: 5100
Unit Type and Rh: 5100

## 2024-04-07 DIAGNOSIS — D649 Anemia, unspecified: Secondary | ICD-10-CM | POA: Diagnosis not present

## 2024-04-07 DIAGNOSIS — I10 Essential (primary) hypertension: Secondary | ICD-10-CM | POA: Diagnosis not present

## 2024-04-21 ENCOUNTER — Inpatient Hospital Stay: Attending: Hematology and Oncology | Admitting: Hematology and Oncology

## 2024-04-21 ENCOUNTER — Inpatient Hospital Stay

## 2024-04-21 ENCOUNTER — Encounter: Payer: Self-pay | Admitting: Hematology and Oncology

## 2024-04-21 VITALS — BP 197/64 | HR 77 | Temp 99.7°F | Resp 18 | Ht 64.0 in | Wt 207.0 lb

## 2024-04-21 DIAGNOSIS — D508 Other iron deficiency anemias: Secondary | ICD-10-CM

## 2024-04-21 DIAGNOSIS — D539 Nutritional anemia, unspecified: Secondary | ICD-10-CM | POA: Diagnosis not present

## 2024-04-21 LAB — CMP (CANCER CENTER ONLY)
ALT: 17 U/L (ref 0–44)
AST: 27 U/L (ref 15–41)
Albumin: 4.1 g/dL (ref 3.5–5.0)
Alkaline Phosphatase: 76 U/L (ref 38–126)
Anion gap: 11 (ref 5–15)
BUN: 14 mg/dL (ref 8–23)
CO2: 24 mmol/L (ref 22–32)
Calcium: 9.1 mg/dL (ref 8.9–10.3)
Chloride: 103 mmol/L (ref 98–111)
Creatinine: 1.01 mg/dL — ABNORMAL HIGH (ref 0.44–1.00)
GFR, Estimated: 57 mL/min — ABNORMAL LOW (ref >60)
Glucose, Bld: 91 mg/dL (ref 70–99)
Potassium: 4.1 mmol/L (ref 3.5–5.1)
Sodium: 138 mmol/L (ref 135–145)
Total Bilirubin: 0.7 mg/dL (ref 0.0–1.2)
Total Protein: 7.1 g/dL (ref 6.5–8.1)

## 2024-04-21 LAB — CBC WITH DIFFERENTIAL (CANCER CENTER ONLY)
Abs Immature Granulocytes: 0.03 10*3/uL (ref 0.00–0.07)
Basophils Absolute: 0.1 10*3/uL (ref 0.0–0.1)
Basophils Relative: 1 %
Eosinophils Absolute: 0.2 10*3/uL (ref 0.0–0.5)
Eosinophils Relative: 3 %
HCT: 34.8 % — ABNORMAL LOW (ref 36.0–46.0)
Hemoglobin: 10.7 g/dL — ABNORMAL LOW (ref 12.0–15.0)
Immature Granulocytes: 0 %
Lymphocytes Relative: 23 %
Lymphs Abs: 1.8 10*3/uL (ref 0.7–4.0)
MCH: 23.7 pg — ABNORMAL LOW (ref 26.0–34.0)
MCHC: 30.7 g/dL (ref 30.0–36.0)
MCV: 77 fL — ABNORMAL LOW (ref 80.0–100.0)
Monocytes Absolute: 0.7 10*3/uL (ref 0.1–1.0)
Monocytes Relative: 9 %
Neutro Abs: 5 10*3/uL (ref 1.7–7.7)
Neutrophils Relative %: 64 %
Platelet Count: 400 10*3/uL (ref 150–400)
RBC: 4.52 MIL/uL (ref 3.87–5.11)
RDW: 23 % — ABNORMAL HIGH (ref 11.5–15.5)
WBC Count: 7.8 10*3/uL (ref 4.0–10.5)
nRBC: 0 % (ref 0.0–0.2)

## 2024-04-21 LAB — SEDIMENTATION RATE: Sed Rate: 6 mm/h (ref 0–22)

## 2024-04-21 LAB — IRON AND IRON BINDING CAPACITY (CC-WL,HP ONLY)
Iron: 38 ug/dL (ref 28–170)
Saturation Ratios: 9 % — ABNORMAL LOW (ref 10.4–31.8)
TIBC: 413 ug/dL (ref 250–450)
UIBC: 375 ug/dL

## 2024-04-21 LAB — VITAMIN B12: Vitamin B-12: 397 pg/mL (ref 180–914)

## 2024-04-21 LAB — FERRITIN: Ferritin: 34 ng/mL (ref 11–307)

## 2024-04-21 NOTE — Progress Notes (Signed)
  Cancer Center FOLLOW-UP progress notes  Patient Care Team: Default, Provider, MD as PCP - General  CHIEF COMPLAINTS/PURPOSE OF VISIT:  Recurrent iron  deficiency anemia  HISTORY OF PRESENTING ILLNESS:  Gabriela Sanchez 78 y.o. female was transferred to my care after her prior physician has left.  I reviewed the patient's records extensive and collaborated the history with the patient. Summary of her history is as follows:  She was last seen by Dr. Dirk Fredericks in December 2023 She is noted to have severe iron  deficiency anemia since 2022 At baseline, on February 09, 2008, she have normal hemoglobin of 13.3 On November 07, 2021, she was noted to have anemia with a hemoglobin of 5.2 and MCV of 59.5 She started received multiple doses of intravenous iron  sucrose in 2023 as well as blood transfusion support On October 10, 2022, her CBC was completely normal She was then lost to follow-up Most recently, on 03/30/2024, her white count was 5.6, hemoglobin 6.5, MCV of 74.2 and platelet count of 422 She was transfused with blood and was recommended oral iron  supplement Today, repeat CBC showed white count of 7.8, hemoglobin 10.7, MCV of 77 and platelet count of 400 Her last colonoscopy was on April 21, 2003 that came back normal The patient declined further GI workup She is currently not taking any antiplatelet agents or NSAID The patient denies any recent signs or symptoms of bleeding such as spontaneous epistaxis, hematuria or hematochezia. She complained of fatigue and reduced stamina but denies chest pain or shortness of breath on exertion The patient has attempted to lose weight and modify her diet and now she started eating meat again.  She attributed her prior diet as the cause of her recurrent iron  deficiency anemia   MEDICAL HISTORY:  Past Medical History:  Diagnosis Date   Anemia    Hypertension     SURGICAL HISTORY: Past Surgical History:  Procedure Laterality Date    ABDOMINAL HYSTERECTOMY      SOCIAL HISTORY: Social History   Socioeconomic History   Marital status: Widowed    Spouse name: Not on file   Number of children: Not on file   Years of education: Not on file   Highest education level: Not on file  Occupational History   Not on file  Tobacco Use   Smoking status: Never   Smokeless tobacco: Never  Vaping Use   Vaping status: Never Used  Substance and Sexual Activity   Alcohol  use: No   Drug use: No   Sexual activity: Not on file  Other Topics Concern   Not on file  Social History Narrative   Not on file   Social Drivers of Health   Financial Resource Strain: Not on file  Food Insecurity: Not on file  Transportation Needs: Not on file  Physical Activity: Not on file  Stress: Not on file  Social Connections: Not on file  Intimate Partner Violence: Not on file    FAMILY HISTORY: Family History  Problem Relation Age of Onset   Cancer Mother    Cancer Father     ALLERGIES:  is allergic to ampicillin, erythromycin ethylsuccinate, lisinopril, losartan, other, simvastatin, sulfa antibiotics, sulfamethoxazole, and penicillin g.  MEDICATIONS:  Current Outpatient Medications  Medication Sig Dispense Refill   hydrochlorothiazide  (HYDRODIURIL ) 25 MG tablet Take 12.5 mg by mouth daily.     valsartan (DIOVAN) 160 MG tablet Take 160 mg by mouth daily.     No current facility-administered medications for this visit.  REVIEW OF SYSTEMS:  All other systems were reviewed with the patient and are negative.  PHYSICAL EXAMINATION: ECOG PERFORMANCE STATUS: 1 - Symptomatic but completely ambulatory  Vitals:   04/21/24 1302  BP: (!) 197/64  Pulse: 77  Resp: 18  Temp: 99.7 F (37.6 C)  SpO2: 98%   Filed Weights   04/21/24 1302  Weight: 207 lb (93.9 kg)    GENERAL:alert, no distress and comfortable NEURO: no focal motor/sensory deficits  LABORATORY DATA:  I have reviewed the data as listed Lab Results  Component  Value Date   WBC 7.8 04/21/2024   HGB 10.7 (L) 04/21/2024   HCT 34.8 (L) 04/21/2024   MCV 77.0 (L) 04/21/2024   PLT 400 04/21/2024   Recent Labs    03/30/24 0908 04/21/24 1321  NA 132* 138  K 4.0 4.1  CL 102 103  CO2 23 24  GLUCOSE 122* 91  BUN 12 14  CREATININE 1.00 1.01*  CALCIUM  8.7* 9.1  GFRNONAA 58* 57*  PROT  --  7.1  ALBUMIN  --  4.1  AST  --  27  ALT  --  17  ALKPHOS  --  76  BILITOT  --  0.7    RADIOGRAPHIC STUDIES: I have personally reviewed the radiological images as listed and agreed with the findings in the report. No results found.  ASSESSMENT & PLAN:  Iron  deficiency anemia The cause of her recurrent iron  deficiency anemia is unknown The patient has decline repeat GI workup  The most likely cause of her anemia is due to chronic blood loss/malabsorption syndrome. We discussed some of the risks, benefits, and alternatives of intravenous iron  infusions. The patient is symptomatic from anemia and the iron  level is critically low. She tolerated oral iron  supplement poorly and desires to achieved higher levels of iron  faster for adequate hematopoesis. Some of the side-effects to be expected including risks of infusion reactions, phlebitis, headaches, nausea and fatigue.  The patient is willing to proceed. Patient education material was dispensed.  Goal is to keep ferritin level greater than 50 and resolution of anemia Recommend 3 doses of intravenous iron  Given recurrent iron  deficiency anemia, I plan to see her again in a year for further follow-up  Orders Placed This Encounter  Procedures   CBC with Differential (Cancer Center Only)    Standing Status:   Future    Number of Occurrences:   1    Expiration Date:   04/21/2025   Ferritin    Standing Status:   Future    Number of Occurrences:   1    Expiration Date:   04/21/2025   Iron  and Iron  Binding Capacity ([facility] only)    Standing Status:   Future    Number of Occurrences:   1    Expiration Date:    04/21/2025   Vitamin B12    Standing Status:   Future    Number of Occurrences:   1    Expiration Date:   04/21/2025   Sedimentation rate    Standing Status:   Future    Number of Occurrences:   1    Expiration Date:   04/21/2025   CMP (Cancer Center only)    Standing Status:   Future    Number of Occurrences:   1    Expiration Date:   04/21/2025   CMP (Cancer Center only)    Standing Status:   Future    Expiration Date:   04/21/2025   CBC  with Differential (Cancer Center Only)    Standing Status:   Future    Expiration Date:   04/21/2025   Ferritin    Standing Status:   Future    Expiration Date:   04/21/2025   Iron  and Iron  Binding Capacity ([facility] only)    Standing Status:   Future    Expiration Date:   04/21/2025    All questions were answered. The patient knows to call the clinic with any problems, questions or concerns. The total time spent in the appointment was 40 minutes encounter with patients including review of chart and various tests results, discussions about plan of care and coordination of care plan   Almeda Jacobs, MD 04/21/2024 3:11 PM

## 2024-04-21 NOTE — Assessment & Plan Note (Signed)
 The cause of her recurrent iron  deficiency anemia is unknown The patient has decline repeat GI workup  The most likely cause of her anemia is due to chronic blood loss/malabsorption syndrome. We discussed some of the risks, benefits, and alternatives of intravenous iron  infusions. The patient is symptomatic from anemia and the iron  level is critically low. She tolerated oral iron  supplement poorly and desires to achieved higher levels of iron  faster for adequate hematopoesis. Some of the side-effects to be expected including risks of infusion reactions, phlebitis, headaches, nausea and fatigue.  The patient is willing to proceed. Patient education material was dispensed.  Goal is to keep ferritin level greater than 50 and resolution of anemia Recommend 3 doses of intravenous iron  Given recurrent iron  deficiency anemia, I plan to see her again in a year for further follow-up

## 2024-04-22 ENCOUNTER — Encounter: Payer: Self-pay | Admitting: Hematology and Oncology

## 2024-04-22 ENCOUNTER — Telehealth: Payer: Self-pay

## 2024-04-22 NOTE — Telephone Encounter (Signed)
 Dr. Marton Sleeper, patient will be scheduled as soon as possible.  Auth Submission: NO AUTH NEEDED Site of care: Site of care: [facility] Payer: [employer] medicare Medication & CPT/J Code(s) submitted: Venofer  (Iron  Sucrose) J1756 Route of submission (phone, fax, portal):  Phone # Fax # Auth type: Buy/Bill PB Units/visits requested: 300mg  x 3 doses Reference number:  Approval from: 04/22/24 to 08/22/24

## 2024-04-29 DIAGNOSIS — E7849 Other hyperlipidemia: Secondary | ICD-10-CM | POA: Diagnosis not present

## 2024-04-29 DIAGNOSIS — I471 Supraventricular tachycardia, unspecified: Secondary | ICD-10-CM | POA: Diagnosis not present

## 2024-04-29 DIAGNOSIS — I10 Essential (primary) hypertension: Secondary | ICD-10-CM | POA: Diagnosis not present

## 2024-04-29 DIAGNOSIS — R011 Cardiac murmur, unspecified: Secondary | ICD-10-CM | POA: Diagnosis not present

## 2024-04-29 DIAGNOSIS — R9431 Abnormal electrocardiogram [ECG] [EKG]: Secondary | ICD-10-CM | POA: Diagnosis not present

## 2024-05-02 ENCOUNTER — Ambulatory Visit (INDEPENDENT_AMBULATORY_CARE_PROVIDER_SITE_OTHER)

## 2024-05-02 VITALS — BP 171/83 | HR 71 | Temp 98.4°F | Resp 16 | Ht 64.0 in | Wt 204.6 lb

## 2024-05-02 DIAGNOSIS — D509 Iron deficiency anemia, unspecified: Secondary | ICD-10-CM | POA: Diagnosis not present

## 2024-05-02 DIAGNOSIS — D508 Other iron deficiency anemias: Secondary | ICD-10-CM

## 2024-05-02 MED ORDER — SODIUM CHLORIDE 0.9 % IV SOLN
300.0000 mg | INTRAVENOUS | Status: DC
  Administered 2024-05-02: 300 mg via INTRAVENOUS
  Filled 2024-05-02: qty 15

## 2024-05-02 MED ORDER — ACETAMINOPHEN 325 MG PO TABS
650.0000 mg | ORAL_TABLET | Freq: Once | ORAL | Status: AC
  Administered 2024-05-02: 650 mg via ORAL
  Filled 2024-05-02: qty 2

## 2024-05-02 MED ORDER — DIPHENHYDRAMINE HCL 25 MG PO CAPS
25.0000 mg | ORAL_CAPSULE | Freq: Once | ORAL | Status: AC
  Administered 2024-05-02: 25 mg via ORAL
  Filled 2024-05-02: qty 1

## 2024-05-02 NOTE — Progress Notes (Signed)
 Diagnosis: Iron  Deficiency Anemia  Provider:  Praveen Mannam MD  Procedure: IV Infusion  IV Type: Peripheral, IV Location: L Antecubital  Venofer  (Iron  Sucrose), Dose: 300 mg  Infusion Start Time: 1359  Infusion Stop Time: 1541  Post Infusion IV Care: Observation period completed  Discharge: Condition: Good, Destination: Home . AVS Provided  Performed by:  Eleanor DELENA Bloch, RN

## 2024-05-09 ENCOUNTER — Ambulatory Visit

## 2024-05-09 VITALS — BP 196/75 | HR 65 | Temp 97.9°F | Resp 16 | Ht 64.0 in | Wt 204.4 lb

## 2024-05-09 DIAGNOSIS — D509 Iron deficiency anemia, unspecified: Secondary | ICD-10-CM | POA: Diagnosis not present

## 2024-05-09 DIAGNOSIS — D508 Other iron deficiency anemias: Secondary | ICD-10-CM

## 2024-05-09 MED ORDER — SODIUM CHLORIDE 0.9 % IV SOLN
300.0000 mg | INTRAVENOUS | Status: DC
  Administered 2024-05-09: 300 mg via INTRAVENOUS
  Filled 2024-05-09: qty 15

## 2024-05-09 MED ORDER — DIPHENHYDRAMINE HCL 25 MG PO CAPS
25.0000 mg | ORAL_CAPSULE | Freq: Once | ORAL | Status: AC
  Administered 2024-05-09: 25 mg via ORAL
  Filled 2024-05-09: qty 1

## 2024-05-09 MED ORDER — ACETAMINOPHEN 325 MG PO TABS
650.0000 mg | ORAL_TABLET | Freq: Once | ORAL | Status: AC
  Administered 2024-05-09: 650 mg via ORAL
  Filled 2024-05-09: qty 2

## 2024-05-09 NOTE — Progress Notes (Signed)
 Diagnosis: Iron  Deficiency Anemia  Provider:  Praveen Mannam MD  Procedure: IV Infusion  IV Type: Peripheral, IV Location: R Antecubital  Venofer  (Iron  Sucrose), Dose: 300 mg  Infusion Start Time: 1355  Infusion Stop Time: 1535  Post Infusion IV Care: Patient declined observation and Peripheral IV Discontinued  Discharge: Condition: Good, Destination: Home . AVS Declined  Performed by:  Maximiano JONELLE Pouch, LPN

## 2024-05-12 DIAGNOSIS — D508 Other iron deficiency anemias: Secondary | ICD-10-CM | POA: Diagnosis not present

## 2024-05-12 DIAGNOSIS — I10 Essential (primary) hypertension: Secondary | ICD-10-CM | POA: Diagnosis not present

## 2024-05-16 ENCOUNTER — Ambulatory Visit

## 2024-05-16 VITALS — BP 198/76 | HR 60 | Temp 97.9°F | Resp 18 | Ht 63.0 in | Wt 206.6 lb

## 2024-05-16 DIAGNOSIS — D509 Iron deficiency anemia, unspecified: Secondary | ICD-10-CM

## 2024-05-16 DIAGNOSIS — D508 Other iron deficiency anemias: Secondary | ICD-10-CM

## 2024-05-16 MED ORDER — DIPHENHYDRAMINE HCL 25 MG PO CAPS
25.0000 mg | ORAL_CAPSULE | Freq: Once | ORAL | Status: AC
  Administered 2024-05-16: 25 mg via ORAL
  Filled 2024-05-16: qty 1

## 2024-05-16 MED ORDER — IRON SUCROSE 300 MG IVPB - SIMPLE MED
300.0000 mg | Status: DC
  Administered 2024-05-16: 300 mg via INTRAVENOUS
  Filled 2024-05-16: qty 300

## 2024-05-16 MED ORDER — ACETAMINOPHEN 325 MG PO TABS
650.0000 mg | ORAL_TABLET | Freq: Once | ORAL | Status: AC
  Administered 2024-05-16: 650 mg via ORAL
  Filled 2024-05-16: qty 2

## 2024-05-16 NOTE — Progress Notes (Signed)
 Diagnosis: Iron  Deficiency Anemia  Provider:  Praveen Mannam MD  Procedure: IV Infusion  IV Type: Peripheral, IV Location: L Antecubital  Venofer  (Iron  Sucrose), Dose: 300 mg  Infusion Start Time: 1412  Infusion Stop Time: 1550  Post Infusion IV Care: Observation period completed Patient requested 15 mins. PIV Discontinued.  Discharge: Condition: Good, Destination: Home . AVS Declined  Performed by:  Eleanor DELENA Bloch, RN

## 2024-05-31 ENCOUNTER — Other Ambulatory Visit (HOSPITAL_COMMUNITY): Payer: Self-pay | Admitting: Cardiology

## 2024-05-31 DIAGNOSIS — R002 Palpitations: Secondary | ICD-10-CM

## 2024-05-31 DIAGNOSIS — I10 Essential (primary) hypertension: Secondary | ICD-10-CM

## 2024-05-31 DIAGNOSIS — R0989 Other specified symptoms and signs involving the circulatory and respiratory systems: Secondary | ICD-10-CM

## 2024-05-31 DIAGNOSIS — R9431 Abnormal electrocardiogram [ECG] [EKG]: Secondary | ICD-10-CM

## 2024-07-04 ENCOUNTER — Encounter (HOSPITAL_COMMUNITY): Payer: Self-pay

## 2024-07-05 ENCOUNTER — Telehealth: Payer: Self-pay

## 2024-07-05 ENCOUNTER — Telehealth (HOSPITAL_COMMUNITY): Payer: Self-pay | Admitting: Emergency Medicine

## 2024-07-05 NOTE — Telephone Encounter (Signed)
 Reaching out to patient to offer assistance regarding upcoming cardiac imaging study; pt verbalizes understanding of appt date/time, parking situation and where to check in, pre-test NPO status and medications ordered, and verified current allergies; name and call back number provided for further questions should they arise Rockwell Alexandria RN Navigator Cardiac Imaging [facility] [phone] office [phone] cell

## 2024-07-05 NOTE — Telephone Encounter (Signed)
 LVM requesting patient to return call to office to schedule annual appointment. Left callback number for patient to return call at her convenience.

## 2024-07-06 ENCOUNTER — Ambulatory Visit (HOSPITAL_COMMUNITY)
Admission: RE | Admit: 2024-07-06 | Discharge: 2024-07-06 | Disposition: A | Discharge status: Home / Self Care | Source: Ambulatory Visit | Attending: Cardiology | Admitting: Cardiology

## 2024-07-06 DIAGNOSIS — R0989 Other specified symptoms and signs involving the circulatory and respiratory systems: Secondary | ICD-10-CM | POA: Insufficient documentation

## 2024-07-06 DIAGNOSIS — I10 Essential (primary) hypertension: Secondary | ICD-10-CM | POA: Insufficient documentation

## 2024-07-06 DIAGNOSIS — R002 Palpitations: Secondary | ICD-10-CM | POA: Diagnosis not present

## 2024-07-06 DIAGNOSIS — R9431 Abnormal electrocardiogram [ECG] [EKG]: Secondary | ICD-10-CM | POA: Diagnosis not present

## 2024-07-06 LAB — POCT I-STAT CREATININE: Creatinine, Ser: 1 mg/dL (ref 0.44–1.00)

## 2024-07-06 MED ORDER — IOHEXOL 350 MG/ML SOLN
100.0000 mL | Freq: Once | INTRAVENOUS | Status: AC | PRN
  Administered 2024-07-06: 100 mL via INTRAVENOUS

## 2024-07-06 MED ORDER — NITROGLYCERIN 0.4 MG SL SUBL
0.8000 mg | SUBLINGUAL_TABLET | Freq: Once | SUBLINGUAL | Status: AC
  Administered 2024-07-06: 0.8 mg via SUBLINGUAL

## 2024-08-04 ENCOUNTER — Telehealth: Payer: Self-pay | Admitting: Hematology and Oncology

## 2024-08-04 NOTE — Telephone Encounter (Signed)
 Scheduled appointment per patients request via phone call. Talked with the patient and she is aware of the made appointments.

## 2024-08-15 ENCOUNTER — Inpatient Hospital Stay

## 2024-08-15 ENCOUNTER — Inpatient Hospital Stay: Attending: Hematology and Oncology

## 2024-08-15 ENCOUNTER — Inpatient Hospital Stay: Admitting: Hematology and Oncology

## 2024-08-15 ENCOUNTER — Encounter: Payer: Self-pay | Admitting: Hematology and Oncology

## 2024-08-15 VITALS — BP 243/68 | HR 69 | Temp 99.1°F | Resp 18 | Ht 63.0 in | Wt 207.0 lb

## 2024-08-15 DIAGNOSIS — K649 Unspecified hemorrhoids: Secondary | ICD-10-CM | POA: Insufficient documentation

## 2024-08-15 DIAGNOSIS — I517 Cardiomegaly: Secondary | ICD-10-CM

## 2024-08-15 DIAGNOSIS — D509 Iron deficiency anemia, unspecified: Secondary | ICD-10-CM | POA: Diagnosis present

## 2024-08-15 DIAGNOSIS — I10 Essential (primary) hypertension: Secondary | ICD-10-CM

## 2024-08-15 DIAGNOSIS — D508 Other iron deficiency anemias: Secondary | ICD-10-CM | POA: Diagnosis not present

## 2024-08-15 DIAGNOSIS — R300 Dysuria: Secondary | ICD-10-CM

## 2024-08-15 DIAGNOSIS — D539 Nutritional anemia, unspecified: Secondary | ICD-10-CM

## 2024-08-15 LAB — URINALYSIS, COMPLETE (UACMP) WITH MICROSCOPIC
Bacteria, UA: NONE SEEN
Bilirubin Urine: NEGATIVE
Glucose, UA: NEGATIVE mg/dL
Hgb urine dipstick: NEGATIVE
Ketones, ur: NEGATIVE mg/dL
Nitrite: NEGATIVE
Protein, ur: NEGATIVE mg/dL
Specific Gravity, Urine: 1.009 (ref 1.005–1.030)
pH: 5 (ref 5.0–8.0)

## 2024-08-15 LAB — CMP (CANCER CENTER ONLY)
ALT: 13 U/L (ref 0–44)
AST: 16 U/L (ref 15–41)
Albumin: 4.3 g/dL (ref 3.5–5.0)
Alkaline Phosphatase: 81 U/L (ref 38–126)
Anion gap: 7 (ref 5–15)
BUN: 16 mg/dL (ref 8–23)
CO2: 26 mmol/L (ref 22–32)
Calcium: 9.5 mg/dL (ref 8.9–10.3)
Chloride: 105 mmol/L (ref 98–111)
Creatinine: 1.03 mg/dL — ABNORMAL HIGH (ref 0.44–1.00)
GFR, Estimated: 56 mL/min — ABNORMAL LOW (ref >60)
Glucose, Bld: 169 mg/dL — ABNORMAL HIGH (ref 70–99)
Potassium: 3.9 mmol/L (ref 3.5–5.1)
Sodium: 138 mmol/L (ref 135–145)
Total Bilirubin: 0.4 mg/dL (ref 0.0–1.2)
Total Protein: 7.1 g/dL (ref 6.5–8.1)

## 2024-08-15 LAB — CBC WITH DIFFERENTIAL (CANCER CENTER ONLY)
Abs Immature Granulocytes: 0.02 K/uL (ref 0.00–0.07)
Basophils Absolute: 0.1 K/uL (ref 0.0–0.1)
Basophils Relative: 1 %
Eosinophils Absolute: 0.3 K/uL (ref 0.0–0.5)
Eosinophils Relative: 4 %
HCT: 37.7 % (ref 36.0–46.0)
Hemoglobin: 12.7 g/dL (ref 12.0–15.0)
Immature Granulocytes: 0 %
Lymphocytes Relative: 22 %
Lymphs Abs: 1.6 K/uL (ref 0.7–4.0)
MCH: 29.7 pg (ref 26.0–34.0)
MCHC: 33.7 g/dL (ref 30.0–36.0)
MCV: 88.1 fL (ref 80.0–100.0)
Monocytes Absolute: 0.6 K/uL (ref 0.1–1.0)
Monocytes Relative: 9 %
Neutro Abs: 4.7 K/uL (ref 1.7–7.7)
Neutrophils Relative %: 64 %
Platelet Count: 250 K/uL (ref 150–400)
RBC: 4.28 MIL/uL (ref 3.87–5.11)
RDW: 13.4 % (ref 11.5–15.5)
WBC Count: 7.2 K/uL (ref 4.0–10.5)
nRBC: 0 % (ref 0.0–0.2)

## 2024-08-15 LAB — IRON AND IRON BINDING CAPACITY (CC-WL,HP ONLY)
Iron: 61 ug/dL (ref 28–170)
Saturation Ratios: 18 % (ref 10.4–31.8)
TIBC: 333 ug/dL (ref 250–450)
UIBC: 272 ug/dL (ref 148–442)

## 2024-08-15 LAB — FERRITIN: Ferritin: 107 ng/mL (ref 11–307)

## 2024-08-15 NOTE — Assessment & Plan Note (Addendum)
 She had symptoms of dysuria and recently was placed on antibiotic treatment Her urine culture results from August indicated E. coli but the patient stated that she never got a phone call from her ordering physician to review the test results I will order repeat urinalysis and urine culture today

## 2024-08-15 NOTE — Progress Notes (Signed)
 [facility] OFFICE PROGRESS NOTE  Default, Provider, MD  ASSESSMENT & PLAN:  Assessment & Plan Other iron  deficiency anemia The cause of her recurrent iron  deficiency anemia is likely due to intermittent hemorrhoidal bleeding and poor dietary oral iron  She would benefit from intravenous iron  infusion and her blood count is now normal I plan to see her again in 6 months She can benefit from repeat GI workup in the future if she has severe recurrent iron  deficiency anemia again Dysuria She had symptoms of dysuria and recently was placed on antibiotic treatment Her urine culture results from August indicated E. coli but the patient stated that she never got a phone call from her ordering physician to review the test results I will order repeat urinalysis and urine culture today Left ventricular hypertrophy She has severe left ventricular hypertrophy and poorly controlled hypertension She is currently following a cardiologist from [facility] and would like to switch back to [facility] cardiology group I will send referral HYPERTENSION She has uncontrolled hypertension but not symptomatic As above, I will send cardiology consult    Orders Placed This Encounter  Procedures   Urine Culture    Standing Status:   Future    Number of Occurrences:   1    Expected Date:   08/15/2024    Expiration Date:   08/15/2025   Urinalysis, Complete w Microscopic    Standing Status:   Future    Number of Occurrences:   1    Expected Date:   08/15/2024    Expiration Date:   08/15/2025   Ferritin    Standing Status:   Future    Expiration Date:   08/15/2025   Iron  and Iron  Binding Capacity ([facility] only)    Standing Status:   Future    Expiration Date:   08/15/2025   CBC with Differential (Cancer Center Only)    Standing Status:   Future    Expiration Date:   08/15/2025   Ambulatory referral to Cardiology    Referral Priority:   Urgent    Referral Type:   Consultation    Referral  Reason:   Specialty Services Required    Referred to Provider:   Lonni Slain, MD    Number of Visits Requested:   1    INTERVAL HISTORY: Patient returns for recurrent anemia Symptoms of anemia includes none We reviewed CBC result.  The patient has intermittent hemorrhoidal bleeding.  She had recent dysuria She would like to be referred to local cardiology group  SUMMARY OF HEMATOLOGIC HISTORY:  She was last seen by Dr. Amadeo in December 2023 She is noted to have severe iron  deficiency anemia since 2022 At baseline, on February 09, 2008, she have normal hemoglobin of 13.3 On November 07, 2021, she was noted to have anemia with a hemoglobin of 5.2 and MCV of 59.5 She started received multiple doses of intravenous iron  sucrose in 2023 as well as blood transfusion support On October 10, 2022, her CBC was completely normal She was then lost to follow-up Most recently, on 03/30/2024, her white count was 5.6, hemoglobin 6.5, MCV of 74.2 and platelet count of 422 She was transfused with blood and was recommended oral iron  supplement Today, repeat CBC showed white count of 7.8, hemoglobin 10.7, MCV of 77 and platelet count of 400 Her last colonoscopy was on April 21, 2003 that came back normal The patient declined further GI workup She is currently not taking any antiplatelet agents  or NSAID The patient denies any recent signs or symptoms of bleeding such as spontaneous epistaxis, hematuria or hematochezia. She complained of fatigue and reduced stamina but denies chest pain or shortness of breath on exertion The patient has attempted to lose weight and modify her diet and now she started eating meat again.  She attributed her prior diet as the cause of her recurrent iron  deficiency anemia She received 3 doses of intravenous iron  in August and July 2025 and 1 unit of blood  Lab Results  Component Value Date   VITAMINB12 397 04/21/2024   FERRITIN 34 04/21/2024   HGB 12.7 08/15/2024    RBC 4.28 08/15/2024   Vitals:   08/15/24 1244  BP: (!) 243/68  Pulse: 69  Resp: 18  Temp: 99.1 F (37.3 C)  SpO2: 98%

## 2024-08-15 NOTE — Assessment & Plan Note (Addendum)
 The cause of her recurrent iron  deficiency anemia is likely due to intermittent hemorrhoidal bleeding and poor dietary oral iron  She would benefit from intravenous iron  infusion and her blood count is now normal I plan to see her again in 6 months She can benefit from repeat GI workup in the future if she has severe recurrent iron  deficiency anemia again

## 2024-08-15 NOTE — Assessment & Plan Note (Addendum)
 She has severe left ventricular hypertrophy and poorly controlled hypertension She is currently following a cardiologist from [facility] and would like to switch back to [facility] health cardiology group I will send referral

## 2024-08-15 NOTE — Assessment & Plan Note (Signed)
 She has uncontrolled hypertension but not symptomatic As above, I will send cardiology consult

## 2024-08-17 LAB — URINE CULTURE: Culture: 10000 — AB

## 2024-08-18 ENCOUNTER — Other Ambulatory Visit: Payer: Self-pay

## 2024-08-18 ENCOUNTER — Telehealth: Payer: Self-pay

## 2024-08-18 DIAGNOSIS — B9689 Other specified bacterial agents as the cause of diseases classified elsewhere: Secondary | ICD-10-CM

## 2024-08-18 NOTE — Telephone Encounter (Signed)
-----   Message from [employer] sent at 08/18/2024  8:20 AM EDT ----- Urine culture grew small amount bacteria, a very resistant organism No oral antibiotics available Please send ID consult Tell her unless she has a lot of pain or UTI symptoms, we do not have to treat now but if her symptoms are bad, she will have to go to ER to be admitted for IV antibiotics

## 2024-08-18 NOTE — Telephone Encounter (Signed)
 Called and given below message. She vebralized understanding. She denies any UTI and pain symptoms. She understands to go to ER for UTI symptoms/ pain. She will call the office back for questions/ concerns. Referral sent.

## 2024-08-21 ENCOUNTER — Encounter: Payer: Self-pay | Admitting: Infectious Disease

## 2024-08-21 DIAGNOSIS — D509 Iron deficiency anemia, unspecified: Secondary | ICD-10-CM | POA: Insufficient documentation

## 2024-08-21 DIAGNOSIS — Z22358 Carrier of other enterobacterales: Secondary | ICD-10-CM | POA: Insufficient documentation

## 2024-08-21 NOTE — Progress Notes (Unsigned)
 Reason for Infectious Disease Consult: positive culture for klebsiella in urine  Requesting Physician: Almarie Bedford, MD  Subjective:    Patient ID: Gabriela Sanchez, female    DOB: [birth year], 78 y.o.   MRN: [id number]  HPI  Past Medical History:  Diagnosis Date   Anemia    Hypertension     Past Surgical History:  Procedure Laterality Date   ABDOMINAL HYSTERECTOMY      Family History  Problem Relation Age of Onset   Cancer Mother    Cancer Father       Social History   Socioeconomic History   Marital status: Widowed    Spouse name: Not on file   Number of children: Not on file   Years of education: Not on file   Highest education level: Not on file  Occupational History   Not on file  Tobacco Use   Smoking status: Never   Smokeless tobacco: Never  Vaping Use   Vaping status: Never Used  Substance and Sexual Activity   Alcohol  use: No   Drug use: No   Sexual activity: Not on file  Other Topics Concern   Not on file  Social History Narrative   Not on file   Social Drivers of Health   Financial Resource Strain: Low Risk  (11/24/2023)   Received from [facility]   Overall Financial Resource Strain (CARDIA)    Difficulty of Paying Living Expenses: Not hard at all  Food Insecurity: No Food Insecurity (11/24/2023)   Received from [facility]   Hunger Vital Sign    Within the past 12 months, you worried that your food would run out before you got the money to buy more.: Never true    Within the past 12 months, the food you bought just didn't last and you didn't have money to get more.: Never true  Transportation Needs: No Transportation Needs (11/24/2023)   Received from [facility] - Transportation    Lack of Transportation (Medical): No    Lack of Transportation (Non-Medical): No  Physical Activity: Insufficiently Active (11/24/2023)   Received from [facility]   Exercise Vital Sign    On average, how many days per week do you engage  in moderate to strenuous exercise (like a brisk walk)?: 2 days    On average, how many minutes do you engage in exercise at this level?: 30 min  Stress: Stress Concern Present (11/24/2023)   Received from [facility] - Occupational Stress Questionnaire    Feeling of Stress : To some extent  Social Connections: Moderately Integrated (11/24/2023)   Received from [facility]   Social Network    How would you rate your social network (family, work, friends)?: Adequate participation with social networks    Allergies  Allergen Reactions   Ampicillin     REACTION: rash   Erythromycin Ethylsuccinate     REACTION: rash   Lisinopril Cough   Losartan Other (See Comments)    Chest tightness,sob,dizziness   Other Nausea Only   Simvastatin     REACTION: ARM PAIN   Sulfa Antibiotics Other (See Comments)    Gi upset   Sulfamethoxazole     REACTION: rash   Penicillin G Rash     Current Outpatient Medications:    hydrochlorothiazide  (HYDRODIURIL ) 25 MG tablet, Take 12.5 mg by mouth daily., Disp: , Rfl:    valsartan (DIOVAN) 160 MG tablet, Take 160 mg  by mouth daily., Disp: , Rfl:    Review of Systems     Objective:   Physical Exam        Assessment & Plan:

## 2024-08-22 ENCOUNTER — Other Ambulatory Visit: Payer: Self-pay

## 2024-08-22 ENCOUNTER — Ambulatory Visit: Admitting: Infectious Disease

## 2024-08-22 ENCOUNTER — Encounter: Payer: Self-pay | Admitting: Infectious Disease

## 2024-08-22 VITALS — BP 184/80 | HR 80 | Temp 98.2°F | Wt 205.0 lb

## 2024-08-22 DIAGNOSIS — Z1612 Extended spectrum beta lactamase (ESBL) resistance: Secondary | ICD-10-CM | POA: Diagnosis not present

## 2024-08-22 DIAGNOSIS — Z7185 Encounter for immunization safety counseling: Secondary | ICD-10-CM | POA: Diagnosis not present

## 2024-08-22 DIAGNOSIS — R8271 Bacteriuria: Secondary | ICD-10-CM

## 2024-08-22 DIAGNOSIS — D508 Other iron deficiency anemias: Secondary | ICD-10-CM | POA: Diagnosis not present

## 2024-08-22 DIAGNOSIS — Z22358 Carrier of other enterobacterales: Secondary | ICD-10-CM | POA: Diagnosis not present

## 2024-09-01 ENCOUNTER — Ambulatory Visit: Admitting: Internal Medicine

## 2024-09-19 ENCOUNTER — Encounter: Payer: Self-pay | Admitting: Cardiology

## 2024-09-19 ENCOUNTER — Ambulatory Visit: Attending: Cardiology | Admitting: Cardiology

## 2024-09-19 VITALS — BP 140/74 | HR 65 | Ht 63.0 in | Wt 205.0 lb

## 2024-09-19 DIAGNOSIS — I517 Cardiomegaly: Secondary | ICD-10-CM

## 2024-09-19 DIAGNOSIS — I471 Supraventricular tachycardia, unspecified: Secondary | ICD-10-CM

## 2024-09-19 DIAGNOSIS — I4729 Other ventricular tachycardia: Secondary | ICD-10-CM

## 2024-09-19 NOTE — Patient Instructions (Signed)
 Medication Instructions:  Your physician recommends that you continue on your current medications as directed. Please refer to the Current Medication list given to you today.  *If you need a refill on your cardiac medications before your next appointment, please call your pharmacy*   Follow-Up: At [facility], you and your health needs are our priority.  As part of our continuing mission to provide you with exceptional heart care, our providers are all part of one team.  This team includes your primary Cardiologist (physician) and Advanced Practice Providers or APPs (Physician Assistants and Nurse Practitioners) who all work together to provide you with the care you need, when you need it.  Your next appointment:   As needed with Dr. Lawana Pray

## 2024-09-19 NOTE — Progress Notes (Signed)
 Electrophysiology Office Note:   Date:  09/19/2024  ID:  Gabriela Sanchez, DOB May 27, 1946, MRN [id number]  Primary Cardiologist: None Primary Heart Failure: None Electrophysiologist: Rishith Siddoway Gladis Norton, MD      History of Present Illness:   Gabriela Sanchez is a 78 y.o. female with h/o hypertension, hyperlipidemia, LVH seen today for  for Electrophysiology evaluation of wide-complex tachycardia, SVT at the request of Gabriela Sanchez.    Discussed the use of AI scribe software for clinical note transcription with the patient, who gave verbal consent to proceed.  History of Present Illness Gabriela Sanchez is a 77 year old female who presents for evaluation of heart rhythm concerns.  She was previously under the care of a different cardiologist but switched due to logistical issues with travel for tests. She is now seeking care closer to her home in [place].  She recalls being informed by her previous cardiologist about an 'ugly edge' and something related to bleeding, though she is unsure of the specifics. An echocardiogram in April showed normal heart function with a slightly higher than normal ejection fraction of 70% and mild stiffness of the heart muscle.  In May, she wore a heart monitor, which showed an average heart rate of 63 beats per minute, ranging from 44 to 112 beats per minute. The monitor recorded a brief episode of fast heart rhythm from the bottom chambers at 186 beats per minute lasting five beats, and 26 episodes of abnormal rhythm from the top chambers, with the longest lasting ten beats. Less than 1% of early beats were noted from both the top and bottom chambers.  She has a history of high blood pressure, which she states has been present 'forever'. There is no known family history of heart disease, though she mentions losing two brothers and her parents to cancer. She recalls a possible heart attack in a relative, 'Gabriela Sanchez', but lacks details on his age or  medical history.  She is currently awaiting a cardiac MRI to further evaluate the thickness of her heart muscle.  No current symptoms or complaints related to heart rhythm.    Review of systems complete and found to be negative unless listed in HPI.   EP Information / Studies Reviewed:    EKG is ordered today. Personal review as below.  EKG Interpretation Date/Time:  Monday September 19 2024 11:48:50 EST Ventricular Rate:  64 PR Interval:  178 QRS Duration:  102 QT Interval:  440 QTC Calculation: 453 R Axis:   -34  Text Interpretation: Normal sinus rhythm Left axis deviation Left ventricular hypertrophy with repolarization abnormality ( R in aVL , Cornell product , Romhilt-Estes ) Cannot rule out Septal infarct , age undetermined When compared with ECG of 20-Jun-2002 09:00, QRS duration has increased Minimal criteria for Septal infarct are now Present ST now depressed in Lateral leads Inverted T waves have replaced nonspecific T wave abnormality in Lateral leads QT has lengthened Confirmed by [employer], Arnet Hofferber ([id number]) on 09/19/2024 12:12:12 PM     Risk Assessment/Calculations:           Physical Exam:   VS:  BP (!) 140/74 (BP Location: Left Arm, Patient Position: Sitting, Cuff Size: Large)   Pulse 65   Ht 5' 3 (1.6 m)   Wt 205 lb (93 kg)   SpO2 98%   BMI 36.31 kg/m    Wt Readings from Last 3 Encounters:  09/19/24 205 lb (93 kg)  08/22/24 205 lb (93 kg)  08/15/24 207  lb (93.9 kg)     GEN: Well nourished, well developed in no acute distress NECK: No JVD; No carotid bruits CARDIAC: Regular rate and rhythm, no murmurs, rubs, gallops RESPIRATORY:  Clear to auscultation without rales, wheezing or rhonchi  ABDOMEN: Soft, non-tender, non-distended EXTREMITIES:  No edema; No deformity   ASSESSMENT AND PLAN:    1.  SVT: Has had short runs of SVT as noted on her cardiac monitor.  I do not have access to the strips, but by report, each episode is less than 10 beats.  She is  minimally symptomatic.  [name] continue monitoring.  2.  Ventricular tachycardia: Had nonsustained VT on her cardiac monitor.  Again, patient is asymptomatic.  She has a normal ejection fraction.  No further workup is necessary.  3.  LVH: Likely related to hypertension, though has upcoming cardiac MRI.  If cardiac MRI shows hypertrophic cardiomyopathy, [name] discuss need for primary prevention at that time.  I have asked that her primary cardiologist refer her back pending results of MRI.  Follow up with EP Team PRN   Signed, [name] Gladis Norton, MD

## 2024-09-28 ENCOUNTER — Telehealth: Payer: Self-pay

## 2024-09-28 NOTE — Telephone Encounter (Signed)
 Called patient to clarify her earlier message. Patient reports experiencing dizziness upon standing and lightheadedness. States her blood pressure has been running high. Current medications include aspirin 81 mg every other day, losartan, and iron  supplementation. Symptoms have worsened over the past several days. Patient was advised to proceed to the ER if symptoms do not improve; she expressed reluctance to go at this time.

## 2024-09-29 ENCOUNTER — Inpatient Hospital Stay: Attending: Hematology and Oncology | Admitting: Hematology and Oncology

## 2024-09-29 ENCOUNTER — Telehealth: Payer: Self-pay

## 2024-09-29 ENCOUNTER — Encounter: Payer: Self-pay | Admitting: Hematology and Oncology

## 2024-09-29 VITALS — BP 237/79 | HR 73 | Temp 99.6°F | Resp 18 | Ht 63.0 in | Wt 209.4 lb

## 2024-09-29 DIAGNOSIS — I10 Essential (primary) hypertension: Secondary | ICD-10-CM | POA: Diagnosis not present

## 2024-09-29 MED ORDER — METOPROLOL TARTRATE 25 MG PO TABS: 25.0000 mg | ORAL_TABLET | Freq: Two times a day (BID) | ORAL | 1 refills | Status: DC

## 2024-09-29 NOTE — Assessment & Plan Note (Addendum)
 She is found to have uncontrolled hypertension and recent echocardiogram show left ventricular hypertrophy She is having symptoms of dizziness I reviewed her medication list and confirm as well as verified that she is no longer on hydrochlorothiazide  Due to her limited oral fluid intake, I recommend addition of metoprolol  twice daily We will call her next week to assess for blood pressure control She is scheduled to see a new cardiologist in January

## 2024-09-29 NOTE — Progress Notes (Signed)
 [name]  Patient Care Team: Default, Provider, MD as PCP - General Inocencio, Soyla Lunger, MD as PCP - Electrophysiology (Cardiology)  Assessment & Plan HYPERTENSION She is found to have uncontrolled hypertension and recent echocardiogram show left ventricular hypertrophy She is having symptoms of dizziness I reviewed her medication list and confirm as well as verified that she is no longer on hydrochlorothiazide  Due to her limited oral fluid intake, I recommend addition of metoprolol  twice daily We will call her next week to assess for blood pressure control She is scheduled to see a new cardiologist in January  No orders of the defined types were placed in this encounter.    Gabriela Bedford, MD  INTERVAL HISTORY: she returns for urgent evaluation for uncontrolled hypertension She is having some symptoms of dizziness I verify her medication list It appears that she is only taking valsartan twice daily She ran out of hydrochlorothiazide  a long time ago She denies headaches She admits to limited oral fluid intake We discussed risk and benefits of addition of metoprolol   PHYSICAL EXAMINATION: ECOG PERFORMANCE STATUS: 1 - Symptomatic but completely ambulatory  Vitals:   09/29/24 1543  BP: (!) 237/79  Pulse: 73  Resp: 18  Temp: 99.6 F (37.6 C)  SpO2: 99%   Filed Weights   09/29/24 1543  Weight: 209 lb 6.4 oz (95 kg)    Relevant data reviewed during this visit included medication list

## 2024-09-29 NOTE — Telephone Encounter (Signed)
 Called and scheduled appt at 3:20 pm today, she will bring meds and bp readings. She will arrive at 3 pm.

## 2024-10-04 ENCOUNTER — Telehealth: Payer: Self-pay

## 2024-10-04 NOTE — Telephone Encounter (Signed)
 Called and given below message. She verbalized understanding.

## 2024-10-04 NOTE — Telephone Encounter (Signed)
-----   Message from Almarie Bedford sent at 10/04/2024  7:45 AM EST ----- Can you call and check on her BP situation?

## 2024-10-04 NOTE — Telephone Encounter (Signed)
 Called to check on her bp. Today am bp 156/66. 11/19 bp am 163/68 and pm 156/66. She cannot find her other bp readings but said it is about the same as today. She is taking Valsartan in am and 1/2 tablet at night. Taking metoprolol  BID. Complaining of some dizziness at times when she gets up to fast. She is going to get up slowly from now on. She states that she is going to try to lose weight after the holidays and thinks that is why her bp is elevated.

## 2024-10-04 NOTE — Telephone Encounter (Signed)
 OK, looks better Advise her to follow with cardiologist in January as scheduled

## 2024-10-21 ENCOUNTER — Other Ambulatory Visit: Payer: Self-pay | Admitting: Hematology and Oncology

## 2024-11-11 ENCOUNTER — Ambulatory Visit (HOSPITAL_BASED_OUTPATIENT_CLINIC_OR_DEPARTMENT_OTHER): Admitting: Cardiology

## 2024-11-11 ENCOUNTER — Encounter (HOSPITAL_BASED_OUTPATIENT_CLINIC_OR_DEPARTMENT_OTHER): Payer: Self-pay | Admitting: Cardiology

## 2024-11-11 VITALS — BP 178/66 | HR 65 | Ht 63.0 in | Wt 206.2 lb

## 2024-11-11 DIAGNOSIS — Z5181 Encounter for therapeutic drug level monitoring: Secondary | ICD-10-CM | POA: Diagnosis not present

## 2024-11-11 DIAGNOSIS — Z712 Person consulting for explanation of examination or test findings: Secondary | ICD-10-CM

## 2024-11-11 DIAGNOSIS — I471 Supraventricular tachycardia, unspecified: Secondary | ICD-10-CM | POA: Diagnosis not present

## 2024-11-11 DIAGNOSIS — I10 Essential (primary) hypertension: Secondary | ICD-10-CM

## 2024-11-11 DIAGNOSIS — I119 Hypertensive heart disease without heart failure: Secondary | ICD-10-CM

## 2024-11-11 MED ORDER — CARVEDILOL 6.25 MG PO TABS: 6.2500 mg | ORAL_TABLET | Freq: Two times a day (BID) | ORAL | 3 refills | Status: AC

## 2024-11-11 MED ORDER — SPIRONOLACTONE 25 MG PO TABS: 25.0000 mg | ORAL_TABLET | Freq: Every day | ORAL | 3 refills | Status: AC

## 2024-11-11 NOTE — Patient Instructions (Addendum)
 Please check and see how you are taking the valsartan at home--we have it listed as 160 mg once a day, but if you are taking it twice a day, we need to update that in our system (that would be maximum dose).  Check and see if you are taking metoprolol  (looks like it was last filled 10/26/24). This should be a twice a day medication. If you are, please STOP this with the start of the carvedilol medication (they are cousins and can interact)  I will ask Dr. Lonn if you can go back on amlodipine, as it looked like this helped your blood pressure in the past.  We will start a medication called spironolactone. We will recheck your kidney function in about two weeks after starting this, as it can affect your kidney function and potassium.  Please check your blood pressure at home and bring a log to the next visit.  how to check blood pressure:  -sit comfortably in a chair, feet uncrossed and flat on floor, for 5-10 minutes  -arm ideally should rest at the level of the heart. However, arm should be relaxed and not tense (for example, do not hold the arm up unsupported)  -avoid exercise, caffeine, and tobacco for at least 30 minutes prior to BP reading  -don't take BP cuff reading over clothes (always place on skin directly)  -I prefer to know how well the medication is working, so I would like you to take your readings 1-2 hours after taking your blood pressure medication if possible   FOLLOW UP WITH DR LONNI 11/30/2024 AT 11:40 am   Labs in  1 to 2 weeks

## 2024-11-11 NOTE — Progress Notes (Signed)
 " Cardiology Office Note:  .   Date:  11/11/2024  ID:  Gabriela Sanchez, DOB 08-09-1946, MRN [id number] PCP: Default, Provider, MD  [facility] Providers Cardiologist:  None Electrophysiologist:  Gabriela Gabriela Norton, MD {  History of Present Illness: Gabriela   Gabriela Sanchez is a 79 y.o. female with PMH iron  deficiency anemia, hypertension, LVH seen as a new patient evaluation for management of hypertension and LVH.  Referral from 08/15/24 from Dr. Lonn reviewed. Referred for history of hypertension and LVH. She was followed at [facility] previously. She had a coronary CT 07/06/24 with calcium  score of 0 and no significant CAD. Most recent echo here in 05/2021 with EF 60-65% mild LVH, indetermine diastology, normal RV, no significant valve disease.   [facility] workup reviewed. Most recent echo 02/24/24 with EF 72-75%, GLD -13, severe LVH with SAM, no significant LVOT gradient at rest but 47 mmHg with Valsalva. I cannot see images, and there is no actual measurement of LV thickness on report. 7 day monitor 03/2024 showed 1 5 beat run of NSVT vs. SVT with aberrancy, 26 short SVT episodes, PVC burden <0.1%, no patient reported events. Carotid ultrasounds 01/2024 with <50% stenosis bilaterally.  Today: Has been dealing with high blood pressure for at least 10 years or longer. Doesn't check BP at home regularly, but when she has in the past, it has been high. She does not recall being on other medications for her blood pressure in the past; on chart review, appears she had cough on lisinopril and had chest tightness/shortness of breath on losartan. She was on amlodipine in the past; there were comments regarding this affecting iron  absorbtion. Has not been on spironolactone. She does not recall being on metoprolol , discussed this is a twice a day medication that appears to have been added by Dr. Lonn in November. There are also comments in the chart that she takes valsartan 160 mg in the AM and 80 mg in the  evening. She notes that she fills a pill box and isn't sure exactly what she is taking.   She was seeing a cardiologist at [facility], reports that she was told her heart was bleeding. Wanted to return to [facility] system for her doctors. Did not get cardiac MRI done at [facility].  Saw Dr. Norton 09/19/24 for short runs of SVT and possible NSVT on her monitor. No treatment or further workup recommended at that time, unless cardiac MRI showed significant LVH requiring discussion of ICD.  No specific chest pain/shortness of breath, but does feel tired after exertion.   ROS: Denies chest pain, shortness of breath at rest or with normal exertion. No PND, orthopnea, LE edema or unexpected weight gain. No syncope or palpitations. ROS otherwise negative except as noted.   Studies Reviewed: Gabriela    EKG:       Physical Exam:   VS:  BP (!) 178/66 (Cuff Size: Large)   Pulse 65   Ht 5' 3 (1.6 m)   Wt 206 lb 3.2 oz (93.5 kg)   SpO2 98%   BMI 36.53 kg/m    Wt Readings from Last 3 Encounters:  11/11/24 206 lb 3.2 oz (93.5 kg)  09/29/24 209 lb 6.4 oz (95 kg)  09/19/24 205 lb (93 kg)    GEN: Well nourished, well developed in no acute distress HEENT: Normal, moist mucous membranes NECK: No JVD CARDIAC: regular rhythm, normal S1 and S2, no rubs or gallops. 1/6 systolic murmur, did not appreciate significant change  with valsalva. VASCULAR: Radial and DP pulses 2+ bilaterally. No carotid bruits RESPIRATORY:  Clear to auscultation without rales, wheezing or rhonchi  ABDOMEN: Soft, non-tender, non-distended MUSCULOSKELETAL:  Ambulates independently SKIN: Warm and dry, no edema NEUROLOGIC:  Alert and oriented x 3. No focal neuro deficits noted. PSYCHIATRIC:  Normal affect    ASSESSMENT AND PLAN: .    Hypertension -she is currently on valsartan 160 mg daily and metoprolol  tartrate 25 mg BID per med list, but there is some question as to what she actually has filled in her pill box. She only has valsartan and  iron  on the list in her purse, thinks she is taking valsartan twice a day. -appears she was on amlodipine in the past, there is comments re: this affecting her iron  levels. Gabriela discuss with Dr. Lonn if it is ok for her to return to this if needed -starting spironolactone 25 mg daily, recheck BMET in 2 weeks -change metoprolol  to carvedilol, follow up heart rate and blood pressure -would avoid loop or thiazide diuretic with severe LVH, risk of hypotension/obstruction -reviewed how to check BP, recommended she bring log to follow up visit  Severe LVH (per report) -monitor with intermittent SVT, reported 1 episode of 5 beats of NSVT vs. SVT with aberrancy -discussed cMRI, would do this after blood pressure is better controlled. Suspect it is 2/2 hypertension but we did discuss that MRI can sometimes find other etiologies  CV risk counseling and prevention -recommend heart healthy/Mediterranean diet, with whole grains, fruits, vegetable, fish, lean meats, nuts, and olive oil. Limit salt. -recommend moderate walking, 3-5 times/week for 30-50 minutes each session. Aim for at least 150 minutes/week. Goal should be pace of 3 miles/hours, or walking 1.5 miles in 30 minutes -recommend avoidance of tobacco products. Avoid excess alcohol .  Dispo: 11/30/24 at 11:40 AM  Signed, Shelda Bruckner, MD   Shelda Bruckner, MD, PhD, [facility]  [facility]  [place]  [facility] at [facility] [address] [place], [place] [address] [phone]   "

## 2024-11-30 ENCOUNTER — Encounter (HOSPITAL_BASED_OUTPATIENT_CLINIC_OR_DEPARTMENT_OTHER): Payer: Self-pay | Admitting: Cardiology

## 2024-11-30 ENCOUNTER — Ambulatory Visit (INDEPENDENT_AMBULATORY_CARE_PROVIDER_SITE_OTHER): Admitting: Cardiology

## 2024-11-30 VITALS — BP 152/62 | HR 44 | Ht 63.0 in | Wt 204.5 lb

## 2024-11-30 DIAGNOSIS — I471 Supraventricular tachycardia, unspecified: Secondary | ICD-10-CM | POA: Diagnosis not present

## 2024-11-30 DIAGNOSIS — I4729 Other ventricular tachycardia: Secondary | ICD-10-CM

## 2024-11-30 DIAGNOSIS — I119 Hypertensive heart disease without heart failure: Secondary | ICD-10-CM | POA: Diagnosis not present

## 2024-11-30 DIAGNOSIS — I10 Essential (primary) hypertension: Secondary | ICD-10-CM | POA: Diagnosis not present

## 2024-11-30 DIAGNOSIS — Z7189 Other specified counseling: Secondary | ICD-10-CM | POA: Diagnosis not present

## 2024-11-30 NOTE — Patient Instructions (Addendum)
 STEP 1: -STOP metoprolol . START Carvedilol  6.25 mg twice a day. You can swap out without having to do anything else (take metoprolol  one day, ok to just start carvedilol  the next) -Continue the valsartan 160 mg twice a day  If you can, check your blood pressure after you have started the carvedilol  to see if this is helping the blood pressure. The goal is to get blood pressure consistently less than 130/80, but I am ok for now if we are staying in the 130s-140s. Once we can get the blood pressure consistently less than 150 on the top number, it is ok to gradually start to increase activity.  How to check blood pressure:  -sit comfortably in a chair, feet uncrossed and flat on floor, for 5-10 minutes  -arm ideally should rest at the level of the heart. However, arm should be relaxed and not tense (for example, do not hold the arm up unsupported)  -avoid exercise, caffeine, and tobacco for at least 30 minutes prior to BP reading  -don't take BP cuff reading over clothes (always place on skin directly)  -I prefer to know how well the medication is working, so I would like you to take your readings 1-2 hours after taking your blood pressure medication if possible  STEP 2: If you have been taking the carvedilol  for a week, and your blood pressures are still running mostly 150 or higher on the top: -START spironolactone  25 mg daily -2 weeks after starting spironolactone , come and get blood work -make sure you stay hydrated with water  We will have you come back in 3-4 weeks for a nurse blood pressure check. If the timing lines up, we can check your kidneys that same day. If you can bring your blood pressure log to the visit, that would be helpful. You can also bring your home blood pressure cuff if you want us  to check it.

## 2024-11-30 NOTE — Progress Notes (Signed)
 " Cardiology Office Note:  .   Date:  11/30/2024  ID:  Gabriela Sanchez, DOB [birth year], MRN [id number] PCP: Default, Provider, MD  [facility] Providers Cardiologist:  None Electrophysiologist:  Will Gladis Norton, MD {  History of Present Illness: Gabriela   NOVELLA Sanchez is a 79 y.o. female with PMH iron  deficiency anemia, hypertension, LVH. I met her 11/11/24 as a new patient evaluation for management of hypertension and LVH.  CV history: She was followed at [facility] previously. She had a coronary CT 07/06/24 with calcium  score of 0 and no significant CAD. Most recent echo 02/24/24 with EF 72-75%, GLD -13, severe LVH with SAM, no significant LVOT gradient at rest but 47 mmHg with Valsalva. I cannot see images, and there is no actual measurement of LV thickness on report. 7 day monitor 03/2024 showed 1 5 beat run of NSVT vs. SVT with aberrancy, 26 short SVT episodes, PVC burden <0.1%, no patient reported events. Carotid ultrasounds 01/2024 with <50% stenosis bilaterally.  Reports >10 year history of very high blood pressures. Does not recall being on medications other than metoprolol , valsartan, and amlodipine for her blood pressure, though chart reports cough on lisinopril and had chest tightness/shortness of breath on losartan.   Saw Dr. Norton 09/19/24 for short runs of SVT and possible NSVT on her monitor. No treatment or further workup recommended at that time, unless cardiac MRI showed significant LVH requiring discussion of ICD.  Today: In general, feels ok. Brought in medication list, notes she is taking metoprolol  tartrate 25 mg BID and valsartan 160 mg BID. She thinks that her blood pressure can all be fixed with losing weight. We changed the metoprolol  to carvedilol  and added spironolactone  at the last visit. She has not taken these yet but did have them filled. We again reviewed goals for blood pressure, plan for medication adjustment. See summary below.  ROS: Denies chest pain,  shortness of breath at rest or with normal exertion. No PND, orthopnea, LE edema or unexpected weight gain. No syncope or palpitations. ROS otherwise negative except as noted.   Studies Reviewed: Gabriela    EKG:       Physical Exam:   VS:  BP (!) 152/62   Pulse (!) 44   Ht 5' 3 (1.6 m)   Wt 204 lb 8 oz (92.8 kg)   SpO2 96%   BMI 36.23 kg/m    Wt Readings from Last 3 Encounters:  11/30/24 204 lb 8 oz (92.8 kg)  11/11/24 206 lb 3.2 oz (93.5 kg)  09/29/24 209 lb 6.4 oz (95 kg)    GEN: Well nourished, well developed in no acute distress HEENT: Normal, moist mucous membranes NECK: No JVD CARDIAC: regular rhythm with ectopy, normal S1 and S2, no rubs or gallops. 2/6 systolic murmur, did not appreciate significant change with valsalva. VASCULAR: Radial and DP pulses 2+ bilaterally. No carotid bruits RESPIRATORY:  Clear to auscultation without rales, wheezing or rhonchi  ABDOMEN: Soft, non-tender, non-distended MUSCULOSKELETAL:  Ambulates independently SKIN: Warm and dry, no edema NEUROLOGIC:  Alert and oriented x 3. No focal neuro deficits noted. PSYCHIATRIC:  Normal affect    ASSESSMENT AND PLAN: .    Hypertension, uncontrolled -she is currently on valsartan 160 mg daily and metoprolol  tartrate 25 mg BID. We had made changes at her initial visit, and while she filled the medications, she did not start them -She was on amlodipine in the past, but there were comments re: this affecting her  iron  levels. I reached out to Dr. Lonn and it is ok to use amlodipine if needed -change metoprolol  to carvedilol , follow up heart rate and blood pressure -if BP not at goal after 1-2 weeks of carvedilol , instructed to spironolactone  25 mg daily, recheck BMET in 2 weeks -would avoid loop or thiazide diuretic with severe LVH, risk of hypotension/obstruction -reviewed how to check BP, recommended she bring log to follow up visit  Severe LVH (per report) -monitor with intermittent SVT, reported 1  episode of 5 beats of NSVT vs. SVT with aberrancy -discussed cMRI, would do this after blood pressure is better controlled. Suspect it is 2/2 hypertension but we did discuss that MRI can sometimes find other etiologies  Ectopy, with pSVT -occasional ectopic beats on exam, HR is higher than the 44 listed -on beta blocker, will be cautious with heart rate while adjusting medications  CV risk counseling and prevention -recommend heart healthy/Mediterranean diet, with whole grains, fruits, vegetable, fish, lean meats, nuts, and olive oil. Limit salt. -recommend moderate walking, 3-5 times/week for 30-50 minutes each session. Aim for at least 150 minutes/week. Goal should be pace of 3 miles/hours, or walking 1.5 miles in 30 minutes -recommend avoidance of tobacco products. Avoid excess alcohol .  Dispo: nurse BP check in 3-4 weeks, me or APP in about 2 mos  The following instructions were printed for her: STEP 1: -STOP metoprolol . START Carvedilol  6.25 mg twice a day. You can swap out without having to do anything else (take metoprolol  one day, ok to just start carvedilol  the next) -Continue the valsartan 160 mg twice a day  If you can, check your blood pressure after you have started the carvedilol  to see if this is helping the blood pressure. The goal is to get blood pressure consistently less than 130/80, but I am ok for now if we are staying in the 130s-140s. Once we can get the blood pressure consistently less than 150 on the top number, it is ok to gradually start to increase activity.  How to check blood pressure:  -sit comfortably in a chair, feet uncrossed and flat on floor, for 5-10 minutes  -arm ideally should rest at the level of the heart. However, arm should be relaxed and not tense (for example, do not hold the arm up unsupported)  -avoid exercise, caffeine, and tobacco for at least 30 minutes prior to BP reading  -don't take BP cuff reading over clothes (always place on skin  directly)  -I prefer to know how well the medication is working, so I would like you to take your readings 1-2 hours after taking your blood pressure medication if possible  STEP 2: If you have been taking the carvedilol  for a week, and your blood pressures are still running mostly 150 or higher on the top: -START spironolactone  25 mg daily -2 weeks after starting spironolactone , come and get blood work -make sure you stay hydrated with water  We will have you come back in 3-4 weeks for a nurse blood pressure check. If the timing lines up, we can check your kidneys that same day. If you can bring your blood pressure log to the visit, that would be helpful. You can also bring your home blood pressure cuff if you want us  to check it.  Signed, Shelda Bruckner, MD   Shelda Bruckner, MD, PhD, [facility]  [facility]  [place]  [facility] at [facility]: [place] Rhonda W at [facility] [address] [place],  [place] [address] [phone]   "

## 2025-02-14 ENCOUNTER — Inpatient Hospital Stay: Attending: Hematology and Oncology

## 2025-02-14 ENCOUNTER — Inpatient Hospital Stay: Admitting: Hematology and Oncology
